# Patient Record
Sex: Female | Born: 1935 | Race: Black or African American | Hispanic: No | Marital: Married | Smoking: Never smoker
Health system: Southern US, Community
[De-identification: ages and names within clinical notes are randomized; demographics above are authoritative.]

## PROBLEM LIST (undated history)

## (undated) DIAGNOSIS — N289 Disorder of kidney and ureter, unspecified: Secondary | ICD-10-CM

## (undated) DIAGNOSIS — I10 Essential (primary) hypertension: Secondary | ICD-10-CM

## (undated) DIAGNOSIS — I4891 Unspecified atrial fibrillation: Secondary | ICD-10-CM

## (undated) DIAGNOSIS — M797 Fibromyalgia: Secondary | ICD-10-CM

## (undated) DIAGNOSIS — Z992 Dependence on renal dialysis: Secondary | ICD-10-CM

## (undated) HISTORY — PX: CHOLECYSTECTOMY: SHX55

## (undated) HISTORY — PX: CARDIAC SURGERY: SHX584

## (undated) HISTORY — PX: ABDOMINAL HYSTERECTOMY: SHX81

---

## 2014-04-18 ENCOUNTER — Encounter (HOSPITAL_COMMUNITY): Payer: Self-pay | Admitting: Family Medicine

## 2014-04-18 ENCOUNTER — Emergency Department (HOSPITAL_COMMUNITY)
Admission: EM | Admit: 2014-04-18 | Discharge: 2014-04-18 | Discharge status: Absent without leave | Payer: Self-pay | Source: Home / Self Care

## 2014-04-18 ENCOUNTER — Inpatient Hospital Stay (HOSPITAL_COMMUNITY)
Admission: EM | Admit: 2014-04-18 | Discharge: 2014-05-06 | DRG: 252 | Disposition: A | Discharge status: Home Health | Payer: Medicare HMO | Attending: Vascular Surgery | Admitting: Vascular Surgery

## 2014-04-18 ENCOUNTER — Inpatient Hospital Stay (HOSPITAL_COMMUNITY): Payer: Medicare HMO

## 2014-04-18 DIAGNOSIS — Z6831 Body mass index (BMI) 31.0-31.9, adult: Secondary | ICD-10-CM | POA: Diagnosis not present

## 2014-04-18 DIAGNOSIS — N186 End stage renal disease: Secondary | ICD-10-CM

## 2014-04-18 DIAGNOSIS — T827XXA Infection and inflammatory reaction due to other cardiac and vascular devices, implants and grafts, initial encounter: Principal | ICD-10-CM | POA: Diagnosis present

## 2014-04-18 DIAGNOSIS — E43 Unspecified severe protein-calorie malnutrition: Secondary | ICD-10-CM | POA: Diagnosis present

## 2014-04-18 DIAGNOSIS — D649 Anemia, unspecified: Secondary | ICD-10-CM | POA: Diagnosis present

## 2014-04-18 DIAGNOSIS — Z992 Dependence on renal dialysis: Secondary | ICD-10-CM

## 2014-04-18 DIAGNOSIS — T39395A Adverse effect of other nonsteroidal anti-inflammatory drugs [NSAID], initial encounter: Secondary | ICD-10-CM | POA: Diagnosis present

## 2014-04-18 DIAGNOSIS — M1990 Unspecified osteoarthritis, unspecified site: Secondary | ICD-10-CM | POA: Diagnosis present

## 2014-04-18 DIAGNOSIS — N19 Unspecified kidney failure: Secondary | ICD-10-CM

## 2014-04-18 DIAGNOSIS — I4891 Unspecified atrial fibrillation: Secondary | ICD-10-CM | POA: Diagnosis present

## 2014-04-18 DIAGNOSIS — I9589 Other hypotension: Secondary | ICD-10-CM | POA: Diagnosis present

## 2014-04-18 DIAGNOSIS — Y733 Surgical instruments, materials and gastroenterology and urology devices (including sutures) associated with adverse incidents: Secondary | ICD-10-CM | POA: Diagnosis present

## 2014-04-18 DIAGNOSIS — Z9049 Acquired absence of other specified parts of digestive tract: Secondary | ICD-10-CM | POA: Diagnosis present

## 2014-04-18 DIAGNOSIS — K904 Malabsorption due to intolerance, not elsewhere classified: Secondary | ICD-10-CM | POA: Diagnosis present

## 2014-04-18 DIAGNOSIS — Z7902 Long term (current) use of antithrombotics/antiplatelets: Secondary | ICD-10-CM | POA: Diagnosis not present

## 2014-04-18 DIAGNOSIS — I2510 Atherosclerotic heart disease of native coronary artery without angina pectoris: Secondary | ICD-10-CM | POA: Diagnosis present

## 2014-04-18 DIAGNOSIS — D696 Thrombocytopenia, unspecified: Secondary | ICD-10-CM | POA: Diagnosis present

## 2014-04-18 DIAGNOSIS — Z880 Allergy status to penicillin: Secondary | ICD-10-CM | POA: Diagnosis not present

## 2014-04-18 DIAGNOSIS — Z7982 Long term (current) use of aspirin: Secondary | ICD-10-CM | POA: Diagnosis not present

## 2014-04-18 DIAGNOSIS — I120 Hypertensive chronic kidney disease with stage 5 chronic kidney disease or end stage renal disease: Secondary | ICD-10-CM | POA: Diagnosis present

## 2014-04-18 DIAGNOSIS — I509 Heart failure, unspecified: Secondary | ICD-10-CM

## 2014-04-18 DIAGNOSIS — I82211 Chronic embolism and thrombosis of superior vena cava: Secondary | ICD-10-CM | POA: Diagnosis present

## 2014-04-18 DIAGNOSIS — Z90710 Acquired absence of both cervix and uterus: Secondary | ICD-10-CM

## 2014-04-18 DIAGNOSIS — Z452 Encounter for adjustment and management of vascular access device: Secondary | ICD-10-CM

## 2014-04-18 DIAGNOSIS — Z993 Dependence on wheelchair: Secondary | ICD-10-CM

## 2014-04-18 DIAGNOSIS — M797 Fibromyalgia: Secondary | ICD-10-CM | POA: Diagnosis present

## 2014-04-18 DIAGNOSIS — I82291 Chronic embolism and thrombosis of other thoracic veins: Secondary | ICD-10-CM | POA: Diagnosis present

## 2014-04-18 DIAGNOSIS — N2581 Secondary hyperparathyroidism of renal origin: Secondary | ICD-10-CM | POA: Diagnosis present

## 2014-04-18 DIAGNOSIS — T8140XA Infection following a procedure, unspecified, initial encounter: Secondary | ICD-10-CM

## 2014-04-18 DIAGNOSIS — T82898A Other specified complication of vascular prosthetic devices, implants and grafts, initial encounter: Secondary | ICD-10-CM

## 2014-04-18 DIAGNOSIS — E876 Hypokalemia: Secondary | ICD-10-CM | POA: Diagnosis present

## 2014-04-18 DIAGNOSIS — Z01810 Encounter for preprocedural cardiovascular examination: Secondary | ICD-10-CM

## 2014-04-18 HISTORY — DX: Fibromyalgia: M79.7

## 2014-04-18 HISTORY — DX: Disorder of kidney and ureter, unspecified: N28.9

## 2014-04-18 HISTORY — DX: Dependence on renal dialysis: Z99.2

## 2014-04-18 HISTORY — DX: Essential (primary) hypertension: I10

## 2014-04-18 HISTORY — DX: Unspecified atrial fibrillation: I48.91

## 2014-04-18 LAB — BASIC METABOLIC PANEL
Anion gap: 19 — ABNORMAL HIGH (ref 5–15)
BUN: 34 mg/dL — ABNORMAL HIGH (ref 6–23)
CALCIUM: 8.8 mg/dL (ref 8.4–10.5)
CO2: 26 mEq/L (ref 19–32)
Chloride: 97 mEq/L (ref 96–112)
Creatinine, Ser: 7.24 mg/dL — ABNORMAL HIGH (ref 0.50–1.10)
GFR calc Af Amer: 6 mL/min — ABNORMAL LOW (ref >90)
GFR, EST NON AFRICAN AMERICAN: 5 mL/min — AB (ref >90)
Glucose, Bld: 114 mg/dL — ABNORMAL HIGH (ref 70–99)
Potassium: 3.6 mEq/L — ABNORMAL LOW (ref 3.7–5.3)
Sodium: 142 mEq/L (ref 137–147)

## 2014-04-18 LAB — CBC WITH DIFFERENTIAL/PLATELET
BASOS ABS: 0 10*3/uL (ref 0.0–0.1)
BASOS PCT: 0 % (ref 0–1)
EOS PCT: 1 % (ref 0–5)
Eosinophils Absolute: 0.1 10*3/uL (ref 0.0–0.7)
HCT: 30.8 % — ABNORMAL LOW (ref 36.0–46.0)
Hemoglobin: 9.8 g/dL — ABNORMAL LOW (ref 12.0–15.0)
LUC, Absolute: 0 10*3/uL (ref 0.0–0.5)
LUCs, %: 0 % (ref 0–4)
Lymphocytes Relative: 17 % (ref 12–46)
Lymphs Abs: 1.7 10*3/uL (ref 0.7–4.0)
MCH: 26.3 pg (ref 26.0–34.0)
MCHC: 31.8 g/dL (ref 30.0–36.0)
MCV: 82.8 fL (ref 78.0–100.0)
Monocytes Absolute: 0.8 10*3/uL (ref 0.1–1.0)
Monocytes Relative: 8 % (ref 3–12)
Neutro Abs: 7.4 10*3/uL (ref 1.7–7.7)
Neutrophils Relative %: 75 % (ref 43–77)
Platelets: 153 10*3/uL (ref 150–400)
RBC: 3.72 MIL/uL — ABNORMAL LOW (ref 3.87–5.11)
RDW: 3.1 % — AB (ref 11.5–15.5)
WBC: 9.9 10*3/uL (ref 4.0–10.5)
nRBC: 3 /100 WBC — ABNORMAL HIGH

## 2014-04-18 MED ORDER — SUCRALFATE 1 G PO TABS
1.0000 g | ORAL_TABLET | Freq: Four times a day (QID) | ORAL | Status: DC
  Administered 2014-04-19 (×2): 1 g via ORAL
  Filled 2014-04-18 (×5): qty 1

## 2014-04-18 MED ORDER — SODIUM CHLORIDE 0.9 % IV BOLUS (SEPSIS)
1000.0000 mL | Freq: Once | INTRAVENOUS | Status: AC
  Administered 2014-04-18: 1000 mL via INTRAVENOUS

## 2014-04-18 MED ORDER — DULOXETINE HCL 60 MG PO CPEP
60.0000 mg | ORAL_CAPSULE | Freq: Every day | ORAL | Status: DC
  Filled 2014-04-18: qty 1

## 2014-04-18 MED ORDER — PRAVASTATIN SODIUM 20 MG PO TABS
20.0000 mg | ORAL_TABLET | Freq: Every day | ORAL | Status: DC
  Filled 2014-04-18: qty 1

## 2014-04-18 MED ORDER — MIDODRINE HCL 5 MG PO TABS
5.0000 mg | ORAL_TABLET | Freq: Every day | ORAL | Status: DC
  Filled 2014-04-18: qty 1

## 2014-04-18 MED ORDER — ALPRAZOLAM 0.25 MG PO TABS: 0.2500 mg | ORAL_TABLET | Freq: Every day | ORAL | Status: DC | PRN

## 2014-04-18 MED ORDER — TRAZODONE HCL 150 MG PO TABS
150.0000 mg | ORAL_TABLET | Freq: Every day | ORAL | Status: DC
  Administered 2014-04-19: 150 mg via ORAL
  Filled 2014-04-18 (×2): qty 1

## 2014-04-18 MED ORDER — CLONAZEPAM 1 MG PO TABS
1.0000 mg | ORAL_TABLET | Freq: Every day | ORAL | Status: DC
  Administered 2014-04-19: 1 mg via ORAL
  Filled 2014-04-18: qty 1

## 2014-04-18 MED ORDER — ZONISAMIDE 100 MG PO CAPS
100.0000 mg | ORAL_CAPSULE | Freq: Three times a day (TID) | ORAL | Status: DC
Start: 2014-04-19 — End: 2014-05-06
  Administered 2014-04-19 – 2014-05-06 (×46): 100 mg via ORAL
  Filled 2014-04-18 (×57): qty 1

## 2014-04-18 MED ORDER — BISACODYL 5 MG PO TBEC: 5.0000 mg | DELAYED_RELEASE_TABLET | Freq: Every day | ORAL | Status: DC | PRN

## 2014-04-18 MED ORDER — ASPIRIN EC 81 MG PO TBEC
81.0000 mg | DELAYED_RELEASE_TABLET | Freq: Every day | ORAL | Status: DC
  Filled 2014-04-18: qty 1

## 2014-04-18 MED ORDER — CLOPIDOGREL BISULFATE 75 MG PO TABS
75.0000 mg | ORAL_TABLET | Freq: Every day | ORAL | Status: DC
  Filled 2014-04-18: qty 1

## 2014-04-18 NOTE — ED Provider Notes (Signed)
CSN: [id number]     Arrival date & time 04/18/14  1757 History   First MD Initiated Contact with Patient 04/18/14 2026     Chief Complaint  Patient presents with  . Groin Pain  . Wound Infection     (Consider location/radiation/quality/duration/timing/severity/associated sxs/prior Treatment) HPI 78 year old female here from out of town who presents today complaining of pain and discharge at the site of her left femoral dialysis graft. This was placed in [place] on November 20. She has not any problems until today. She is visiting her son from out of town and has been dialyzed twice while she's been here. She has a dialysis catheter placed in the right groin. She denies fever, red streaking, chills, nausea, redness, or vomiting. Past Medical History  Diagnosis Date  . Renal disorder   . Dialysis patient   . Atrial fibrillation   . Fibromyalgia   . Hypertension    Past Surgical History  Procedure Laterality Date  . Cardiac surgery    . Cholecystectomy    . Abdominal hysterectomy     History reviewed. No pertinent family history. History  Substance Use Topics  . Smoking status: Never Smoker   . Smokeless tobacco: Not on file  . Alcohol Use: Not on file   OB History    No data available     Review of Systems  All other systems reviewed and are negative.     Allergies  Penicillins  Home Medications   Prior to Admission medications   Not on File   BP 110/73 mmHg  Pulse 87  Temp(Src) 98.4 F (36.9 C) (Oral)  Resp 18  SpO2 100% Physical Exam  Constitutional: She appears well-developed and well-nourished.  HENT:  Head: Normocephalic and atraumatic.  Right Ear: External ear normal.  Left Ear: External ear normal.  Eyes: Conjunctivae and EOM are normal. Pupils are equal, round, and reactive to light.  Neck: Normal range of motion. Thyromegaly present.  Cardiovascular: An irregularly irregular rhythm present.  Pulmonary/Chest: Effort normal and breath sounds  normal.  Abdominal: Soft. Bowel sounds are normal.  Musculoskeletal:       Legs: Staples in place in left groin with some tenderness to palpation and partially discharge noted. Right groin with dialysis catheter in place.  Neurological: She is alert. She displays abnormal reflex. No cranial nerve deficit. Coordination normal.  Skin: Skin is warm and dry.  Psychiatric: She has a normal mood and affect.  Nursing note and vitals reviewed.   ED Course  Procedures (including critical care time) Labs Review Labs Reviewed  CULTURE, BLOOD (ROUTINE X 2)  CULTURE, BLOOD (ROUTINE X 2)  CBC WITH DIFFERENTIAL  BASIC METABOLIC PANEL    Imaging Review No results found.   EKG Interpretation   Date/Time:  Monday April 18 2014 20:53:31 EST Ventricular Rate:  101 PR Interval:    QRS Duration: 81 QT Interval:  366 QTC Calculation: 474 R Axis:   38 Text Interpretation:  Atrial fibrillation Low voltage, extremity and  precordial leads Nonspecific T abnrm, anterolateral leads Baseline wander  in lead(s) V4 Confirmed by Court Gracia MD, Andee Poles ([id number]) on 04/18/2014 8:57:26  PM      MDM   Final diagnoses:  Preop cardiovascular exam  Post op infection  Renal failure    Patient care discussed with Dr. Oneida Alar and he will be in to assess. She has had blood cultures CBC and be met drawn. IV is to be replaced.  Dr. Oneida Alar in department and admitting patient  Shaune Pollack, MD 04/18/14 [phone]

## 2014-04-18 NOTE — ED Notes (Signed)
Pt sts she had a graft placed in the left groin 2 weeks ago and now possible infection. sts drainage. sts she has a temporary catheter in the right groin. Pt sts last dialysis saturday

## 2014-04-18 NOTE — H&P (Signed)
[facility] [place] HISTORY AND PHYSICAL   History of Present Illness:  Patient is a 78 y.o. year old female who presents for evaluation of left groin drainage.  Pt had left thigh graft placed in [place] approximately 2 weeks ago.  Began to have some pain and clear drainage from groin today.  She is planning on staying in the area for the next 3 months.  She currently is dialyzing T Th [facility]  She denies fever or chills.  She has had no nausea or vomiting.  Other medical problems include atrial fibrillation (she is not clear on why coumadin was stopped), fibromyalgia, arthritis.  Her cause of renal failure was from NSAIDS for her arthritis.  She has been on dialysis approximately 4 years.  She has had multiple failed arm grafts.  She currently is dialyzing via a right femoral catheter.  She is essentially non ambulatory and uses a wheelchair.  Past Medical History  Diagnosis Date  . Renal disorder   . Dialysis patient   . Atrial fibrillation   . Fibromyalgia   . Hypertension     Past Surgical History  Procedure Laterality Date  . Cardiac surgery    . Cholecystectomy    . Abdominal hysterectomy      Social History History  Substance Use Topics  . Smoking status: Never Smoker   . Smokeless tobacco: Not on file  . Alcohol Use: Not on file    Family History History reviewed. No pertinent family history. Denies history of renal failure  Allergies  Allergies  Allergen Reactions  . Lactose Intolerance (Gi) Diarrhea  . Penicillins Itching     Current Facility-Administered Medications  Medication Dose Route Frequency Provider Last Rate Last Dose  . ALPRAZolam (XANAX) tablet 0.25 mg  0.25 mg Oral Daily PRN Sherren Kernsharles E Paxtyn Wisdom, MD      . Melene Muller[START ON 04/19/2014] aspirin EC tablet 81 mg  81 mg Oral Daily Sherren Kernsharles E Aarit Kashuba, MD      . bisacodyl (DULCOLAX) EC tablet 5 mg  5 mg Oral Daily PRN Sherren Kernsharles E Irby Fails, MD      . clonazePAM Scarlette Calico(KLONOPIN) tablet 1 mg  1 mg  Oral QHS Sherren Kernsharles E Homer Miller, MD      . Melene Muller[START ON 04/19/2014] clopidogrel (PLAVIX) tablet 75 mg  75 mg Oral Daily Sherren Kernsharles E Jeweline Reif, MD      . Melene Muller[START ON 04/19/2014] DULoxetine (CYMBALTA) DR capsule 60 mg  60 mg Oral Daily Sherren Kernsharles E Chayse Gracey, MD      . Melene Muller[START ON 04/19/2014] midodrine (PROAMATINE) tablet 5 mg  5 mg Oral Daily Sherren Kernsharles E Ricardo Schubach, MD      . pravastatin (PRAVACHOL) tablet 20 mg  20 mg Oral QHS Sherren Kernsharles E Masa Lubin, MD      . sucralfate (CARAFATE) tablet 1 g  1 g Oral QID Sherren Kernsharles E Merrily Tegeler, MD      . traZODone (DESYREL) tablet 150 mg  150 mg Oral QHS Sherren Kernsharles E Lisett Dirusso, MD      . zonisamide (ZONEGRAN) capsule 100 mg  100 mg Oral TID Sherren Kernsharles E Denisse Whitenack, MD       Current Outpatient Prescriptions  Medication Sig Dispense Refill  . ALPRAZolam (XANAX) 0.25 MG tablet Take 0.25 mg by mouth daily as needed for anxiety (TAKES BEFORE DIALYSIS ONLY).   2  . aspirin EC 81 MG tablet Take 81 mg by mouth daily.    . clonazePAM (KLONOPIN) 1 MG tablet Take 1 mg by mouth at  bedtime.   5  . clopidogrel (PLAVIX) 75 MG tablet Take 75 mg by mouth daily.  5  . [facility] BISACODYL 5 MG EC tablet Take 5 mg by mouth daily as needed for moderate constipation.   0  . DULoxetine (CYMBALTA) 60 MG capsule Take 60 mg by mouth daily.  5  . HYDROcodone-acetaminophen (NORCO/VICODIN) 5-325 MG per tablet Take 1 tablet by mouth every 4 (four) hours as needed.  0  . midodrine (PROAMATINE) 5 MG tablet Take 5 mg by mouth daily.  0  . pravastatin (PRAVACHOL) 20 MG tablet Take 20 mg by mouth at bedtime.   3  . sucralfate (CARAFATE) 1 G tablet Take 1 g by mouth 4 (four) times daily.  3  . traZODone (DESYREL) 150 MG tablet Take 150 mg by mouth at bedtime.  5  . zonisamide (ZONEGRAN) 100 MG capsule Take 100 mg by mouth 3 (three) times daily.  3    ROS:   General:  No weight loss, Fever, chills  HEENT: No recent headaches, no nasal bleeding, no visual changes, no sore throat  Neurologic: No dizziness, blackouts, seizures. No recent symptoms of  stroke or mini- stroke. No recent episodes of slurred speech, or temporary blindness.  Cardiac: No recent episodes of chest pain/pressure, no shortness of breath at rest.  No shortness of breath with exertion.  + history of atrial fibrillation or irregular heartbeat  Vascular: No history of rest pain in feet.  No history of claudication.  No history of non-healing ulcer, No history of DVT   Pulmonary: No home oxygen, no productive cough, no hemoptysis,  No asthma or wheezing  Musculoskeletal:  [x ] Arthritis, [ ]  Low back pain,  [ ]  Joint pain  Hematologic:No history of hypercoagulable state.  No history of easy bleeding.  No history of anemia  Gastrointestinal: No hematochezia or melena,  No gastroesophageal reflux, no trouble swallowing  Urinary: [x ] chronic Kidney disease, [x ] on HD - [ ]  MWF or [x ] TTHS, [ ]  Burning with urination, [ ]  Frequent urination, [ ]  Difficulty urinating;   Skin: No rashes  Psychological: + history of anxiety,  + history of depression   Physical Examination  Filed Vitals:   04/18/14 1814 04/18/14 2034 04/18/14 2100 04/18/14 2145  BP: 99/44 110/73 108/42 105/43  Pulse: 86 87 73 98  Temp: 98.6 F (37 C) 98.4 F (36.9 C)    TempSrc: Oral Oral    Resp: 16 18 20 17   SpO2: 98% 100% 100% 100%    There is no height or weight on file to calculate BMI.  General:  Alert and oriented, no acute distress HEENT: Normal Neck: No JVD Pulmonary: Clear to auscultation bilaterally Cardiac: Irregularly irregular Abdomen: Soft, non-tender, non-distended, no mass Skin: No rash, left groin macerated skin with serous drainage, no erythema, no purulence, no mass, left thigh graft is pulsatile, central 2/3 of skin staples removed from left groin incision which is 5 cm in length Extremity Pulses:  2+ radial, brachial, femoral, right dorsalis pedis, absent left DP and posterior tibial pulses bilaterally Musculoskeletal: No deformity or edema  Neurologic: Upper and  lower extremity motor 5/5 and symmetric  ASSESSMENT:  Possible early graft infection vs lymphatic leak   PLAN: 1.  Consult renal service for dialysis needs.  2.  Admit for IV antibiotics Vanc Zosyn dry dressing for groin  3.  Afib rate controlled for now.  Currently not on anticoagulation reason unknown.   Leonette Mostharles  Crespin Forstrom, MD [facility] Office: [phone] Pager: [phone]

## 2014-04-19 LAB — BASIC METABOLIC PANEL
Anion gap: 19 — ABNORMAL HIGH (ref 5–15)
BUN: 34 mg/dL — AB (ref 6–23)
CHLORIDE: 99 meq/L (ref 96–112)
CO2: 24 mEq/L (ref 19–32)
Calcium: 8.3 mg/dL — ABNORMAL LOW (ref 8.4–10.5)
Creatinine, Ser: 7.24 mg/dL — ABNORMAL HIGH (ref 0.50–1.10)
GFR calc Af Amer: 6 mL/min — ABNORMAL LOW (ref >90)
GFR calc non Af Amer: 5 mL/min — ABNORMAL LOW (ref >90)
GLUCOSE: 125 mg/dL — AB (ref 70–99)
Potassium: 3.5 mEq/L — ABNORMAL LOW (ref 3.7–5.3)
Sodium: 142 mEq/L (ref 137–147)

## 2014-04-19 LAB — CBC
HEMATOCRIT: 30.9 % — AB (ref 36.0–46.0)
Hemoglobin: 9.8 g/dL — ABNORMAL LOW (ref 12.0–15.0)
MCH: 26.3 pg (ref 26.0–34.0)
MCHC: 31.7 g/dL (ref 30.0–36.0)
MCV: 83.1 fL (ref 78.0–100.0)
Platelets: 135 10*3/uL — ABNORMAL LOW (ref 150–400)
RBC: 3.72 MIL/uL — ABNORMAL LOW (ref 3.87–5.11)
RDW: 19.2 % — ABNORMAL HIGH (ref 11.5–15.5)
WBC: 9.6 10*3/uL (ref 4.0–10.5)

## 2014-04-19 LAB — HEPATITIS B SURFACE ANTIGEN: HEP B S AG: NEGATIVE

## 2014-04-19 MED ORDER — PRAVASTATIN SODIUM 20 MG PO TABS
20.0000 mg | ORAL_TABLET | ORAL | Status: DC
  Administered 2014-04-19 – 2014-05-05 (×17): 20 mg via ORAL
  Filled 2014-04-19 (×21): qty 1

## 2014-04-19 MED ORDER — PANTOPRAZOLE SODIUM 40 MG PO TBEC
40.0000 mg | DELAYED_RELEASE_TABLET | ORAL | Status: DC
  Administered 2014-04-19 – 2014-05-05 (×17): 40 mg via ORAL
  Filled 2014-04-19 (×16): qty 1

## 2014-04-19 MED ORDER — CLONAZEPAM 1 MG PO TABS
1.0000 mg | ORAL_TABLET | ORAL | Status: DC
  Administered 2014-04-19 – 2014-05-05 (×14): 1 mg via ORAL
  Filled 2014-04-19 (×16): qty 1

## 2014-04-19 MED ORDER — MIDODRINE HCL 5 MG PO TABS
5.0000 mg | ORAL_TABLET | ORAL | Status: DC
  Administered 2014-04-19 – 2014-04-22 (×5): 5 mg via ORAL
  Filled 2014-04-19 (×6): qty 1

## 2014-04-19 MED ORDER — MIDODRINE HCL 5 MG PO TABS
ORAL_TABLET | ORAL | Status: AC
  Filled 2014-04-19: qty 1

## 2014-04-19 MED ORDER — VANCOMYCIN HCL 10 G IV SOLR
1500.0000 mg | Freq: Once | INTRAVENOUS | Status: AC
  Administered 2014-04-19: 1500 mg via INTRAVENOUS
  Filled 2014-04-19: qty 1500

## 2014-04-19 MED ORDER — DOCUSATE SODIUM 100 MG PO CAPS
100.0000 mg | ORAL_CAPSULE | Freq: Two times a day (BID) | ORAL | Status: DC
  Administered 2014-04-19 – 2014-04-28 (×7): 100 mg via ORAL
  Filled 2014-04-19 (×24): qty 1

## 2014-04-19 MED ORDER — LABETALOL HCL 5 MG/ML IV SOLN
10.0000 mg | INTRAVENOUS | Status: DC | PRN
  Filled 2014-04-19: qty 4

## 2014-04-19 MED ORDER — MIDODRINE HCL 5 MG PO TABS: 5.0000 mg | ORAL_TABLET | Freq: Every day | ORAL | Status: DC

## 2014-04-19 MED ORDER — SODIUM CHLORIDE 0.9 % IJ SOLN: 3.0000 mL | INTRAMUSCULAR | Status: DC | PRN

## 2014-04-19 MED ORDER — ACETAMINOPHEN 325 MG PO TABS
ORAL_TABLET | ORAL | Status: AC
  Filled 2014-04-19: qty 2

## 2014-04-19 MED ORDER — VANCOMYCIN HCL IN DEXTROSE 1-5 GM/200ML-% IV SOLN: 1000.0000 mg | Freq: Two times a day (BID) | INTRAVENOUS | Status: DC

## 2014-04-19 MED ORDER — DULOXETINE HCL 60 MG PO CPEP
60.0000 mg | ORAL_CAPSULE | ORAL | Status: DC
  Administered 2014-04-19 – 2014-05-05 (×17): 60 mg via ORAL
  Filled 2014-04-19 (×20): qty 1

## 2014-04-19 MED ORDER — ASPIRIN EC 81 MG PO TBEC
81.0000 mg | DELAYED_RELEASE_TABLET | ORAL | Status: DC
  Administered 2014-04-19 – 2014-04-26 (×8): 81 mg via ORAL
  Filled 2014-04-19 (×10): qty 1

## 2014-04-19 MED ORDER — OXYCODONE-ACETAMINOPHEN 5-325 MG PO TABS
1.0000 | ORAL_TABLET | ORAL | Status: DC | PRN
  Administered 2014-04-19 – 2014-04-24 (×6): 1 via ORAL
  Administered 2014-04-26: 2 via ORAL
  Administered 2014-04-30 – 2014-05-01 (×3): 1 via ORAL
  Administered 2014-05-03 (×2): 2 via ORAL
  Administered 2014-05-03: 1 via ORAL
  Administered 2014-05-05 – 2014-05-06 (×2): 2 via ORAL
  Filled 2014-04-19 (×4): qty 1
  Filled 2014-04-19 (×2): qty 2
  Filled 2014-04-19: qty 1
  Filled 2014-04-19: qty 2
  Filled 2014-04-19: qty 1
  Filled 2014-04-19: qty 2
  Filled 2014-04-19 (×2): qty 1
  Filled 2014-04-19: qty 2
  Filled 2014-04-19: qty 1

## 2014-04-19 MED ORDER — TRAZODONE HCL 150 MG PO TABS
150.0000 mg | ORAL_TABLET | ORAL | Status: DC
  Administered 2014-04-19 – 2014-05-05 (×16): 150 mg via ORAL
  Filled 2014-04-19 (×20): qty 1

## 2014-04-19 MED ORDER — PANTOPRAZOLE SODIUM 40 MG PO TBEC
40.0000 mg | DELAYED_RELEASE_TABLET | Freq: Every day | ORAL | Status: DC
  Filled 2014-04-19: qty 1

## 2014-04-19 MED ORDER — RENA-VITE PO TABS
1.0000 | ORAL_TABLET | Freq: Every day | ORAL | Status: DC
Start: 2014-04-19 — End: 2014-05-06
  Administered 2014-04-20 – 2014-05-05 (×15): 1 via ORAL
  Filled 2014-04-19 (×19): qty 1

## 2014-04-19 MED ORDER — METOPROLOL TARTRATE 1 MG/ML IV SOLN: 2.0000 mg | INTRAVENOUS | Status: DC | PRN

## 2014-04-19 MED ORDER — PIPERACILLIN-TAZOBACTAM 3.375 G IVPB
3.3750 g | Freq: Three times a day (TID) | INTRAVENOUS | Status: DC
  Filled 2014-04-19 (×2): qty 50

## 2014-04-19 MED ORDER — CLOPIDOGREL BISULFATE 75 MG PO TABS
75.0000 mg | ORAL_TABLET | ORAL | Status: DC
  Administered 2014-04-19 – 2014-04-26 (×8): 75 mg via ORAL
  Filled 2014-04-19 (×10): qty 1

## 2014-04-19 MED ORDER — MORPHINE SULFATE 2 MG/ML IJ SOLN
2.0000 mg | INTRAMUSCULAR | Status: DC | PRN
  Administered 2014-04-27 (×2): 2 mg via INTRAVENOUS
  Filled 2014-04-19 (×2): qty 1

## 2014-04-19 MED ORDER — CALCITRIOL 0.5 MCG PO CAPS
0.5000 ug | ORAL_CAPSULE | ORAL | Status: DC
  Administered 2014-04-23 – 2014-05-05 (×6): 0.5 ug via ORAL
  Filled 2014-04-19 (×8): qty 1

## 2014-04-19 MED ORDER — ONDANSETRON HCL 4 MG/2ML IJ SOLN
4.0000 mg | Freq: Four times a day (QID) | INTRAMUSCULAR | Status: DC | PRN
  Administered 2014-04-22: 4 mg via INTRAVENOUS
  Filled 2014-04-19: qty 2

## 2014-04-19 MED ORDER — SODIUM CHLORIDE 0.9 % IV SOLN
250.0000 mL | INTRAVENOUS | Status: DC | PRN
  Administered 2014-04-27 (×2): via INTRAVENOUS

## 2014-04-19 MED ORDER — HYDRALAZINE HCL 20 MG/ML IJ SOLN: 5.0000 mg | INTRAMUSCULAR | Status: DC | PRN

## 2014-04-19 MED ORDER — SODIUM CHLORIDE 0.9 % IV SOLN
62.5000 mg | INTRAVENOUS | Status: DC
  Administered 2014-04-21 – 2014-05-05 (×3): 62.5 mg via INTRAVENOUS
  Filled 2014-04-19 (×6): qty 5

## 2014-04-19 MED ORDER — HEPARIN SODIUM (PORCINE) 5000 UNIT/ML IJ SOLN
5000.0000 [IU] | Freq: Three times a day (TID) | INTRAMUSCULAR | Status: DC
  Administered 2014-04-19 – 2014-04-27 (×23): 5000 [IU] via SUBCUTANEOUS
  Filled 2014-04-19 (×30): qty 1

## 2014-04-19 MED ORDER — ACETAMINOPHEN 650 MG RE SUPP: 325.0000 mg | RECTAL | Status: DC | PRN

## 2014-04-19 MED ORDER — SODIUM CHLORIDE 0.9 % IJ SOLN
3.0000 mL | Freq: Two times a day (BID) | INTRAMUSCULAR | Status: DC
  Administered 2014-04-19 – 2014-05-02 (×16): 3 mL via INTRAVENOUS

## 2014-04-19 MED ORDER — VANCOMYCIN HCL IN DEXTROSE 750-5 MG/150ML-% IV SOLN
750.0000 mg | INTRAVENOUS | Status: DC
  Administered 2014-04-19: 750 mg via INTRAVENOUS
  Filled 2014-04-19 (×3): qty 150

## 2014-04-19 MED ORDER — PIPERACILLIN-TAZOBACTAM IN DEX 2-0.25 GM/50ML IV SOLN
2.2500 g | Freq: Three times a day (TID) | INTRAVENOUS | Status: DC
  Administered 2014-04-19 – 2014-05-03 (×42): 2.25 g via INTRAVENOUS
  Filled 2014-04-19 (×48): qty 50

## 2014-04-19 MED ORDER — BISACODYL 10 MG RE SUPP: 10.0000 mg | Freq: Every day | RECTAL | Status: DC | PRN

## 2014-04-19 MED ORDER — ACETAMINOPHEN 325 MG PO TABS
325.0000 mg | ORAL_TABLET | ORAL | Status: DC | PRN
  Administered 2014-04-19 – 2014-04-22 (×3): 650 mg via ORAL
  Filled 2014-04-19 (×2): qty 2

## 2014-04-19 NOTE — Progress Notes (Addendum)
  [facility] Progress Note  04/19/2014 2:40 PM  Subjective:  No complaints. Had pain in left thigh that is well controlled with pain medicine.   Tmax 98.6 BP sys 90s-115 02 99% 3L  Filed Vitals:   04/19/14 1359  BP: 91/47  Pulse: 85  Temp: 98.4 F (36.9 C)  Resp: 17    Physical Exam: Incisions:  Left groin incision with maceration and active serous drainage. Staples to proximal 2/3 of incision. Dressing was soaked through. Last dressing change around 1300. No erythema. No purulence. Right thigh perm cath.   CBC    Component Value Date/Time   WBC 9.6 04/19/2014 0310   RBC 3.72* 04/19/2014 0310   HGB 9.8* 04/19/2014 0310   HCT 30.9* 04/19/2014 0310   PLT 135* 04/19/2014 0310   MCV 83.1 04/19/2014 0310   MCH 26.3 04/19/2014 0310   MCHC 31.7 04/19/2014 0310   RDW 19.2* 04/19/2014 0310   LYMPHSABS 1.7 04/18/2014 2102   MONOABS 0.8 04/18/2014 2102   EOSABS 0.1 04/18/2014 2102   BASOSABS 0 04/18/2014 2102    BMET    Component Value Date/Time   NA 142 04/19/2014 0310   K 3.5* 04/19/2014 0310   CL 99 04/19/2014 0310   CO2 24 04/19/2014 0310   GLUCOSE 125* 04/19/2014 0310   BUN 34* 04/19/2014 0310   CREATININE 7.24* 04/19/2014 0310   CALCIUM 8.3* 04/19/2014 0310   GFRNONAA 5* 04/19/2014 0310   GFRAA 6* 04/19/2014 0310    INR No results found for: INR   Intake/Output Summary (Last 24 hours) at 04/19/14 1440 Last data filed at 04/19/14 1300  Gross per 24 hour  Intake   1110 ml  Output      0 ml  Net   1110 ml     Assessment:  78 y.o. female with possibly early thigh graft infection vs lymphatic leak  Plan: -Continuous serous drainage from left groin. No purulence or erythema.  -Blood cultures pending. Wound culture obtained.  -Afebrile, no leukocytosis. -Continue vanc and zosyn -For dialysis today.  -DVT prophylaxis:  SQ heparin.    Maris BergerKimberly Trinh, PA-C [facility] Office: [phone] Pager:  [phone] 04/19/2014 2:40 PM     If continues current excessive drainage will consider VAC  Fabienne Brunsharles Makiah Clauson, MD [facility] [place] Office: [phone] Pager: [phone]

## 2014-04-19 NOTE — Plan of Care (Signed)
Problem: Phase I Progression Outcomes Goal: Hemodynamically stable Outcome: Completed/Met Date Met:  04/19/14

## 2014-04-19 NOTE — Progress Notes (Signed)
Pt did not tolerate HD tx well today. Hypotensive, fatigue and cramping for most of tx. Pt. Also ended tx 30 mins early due to cramps in her stomach and surgical site pain in left leg. 0.5 L removed. Pt states, "I do not feel like I have fluid on me". Unable to give pain meds other than 650 mg Tylenol due to hypotension. Report given to 2W RN.

## 2014-04-19 NOTE — Progress Notes (Signed)
ANTIBIOTIC CONSULT NOTE - INITIAL  Pharmacy Consult for Vancomycin and Zosyn  Indication: possible graft infection  Allergies  Allergen Reactions  . Lactose Intolerance (Gi) Diarrhea  . Penicillins Itching    Patient Measurements: Height: 5\' 2"  (157.5 cm) Weight: 172 lb 2.9 oz (78.1 kg) IBW/kg (Calculated) : 50.1  Vital Signs: Temp: 97.9 F (36.6 C) (11/30 2357) Temp Source: Oral (11/30 2357) BP: 100/56 mmHg (11/30 2357) Pulse Rate: 107 (11/30 2357) Intake/Output from previous day:   Intake/Output from this shift:    Labs:  Recent Labs  04/18/14 2102  WBC 9.9  HGB 9.8*  PLT 153  CREATININE 7.24*   Estimated Creatinine Clearance: 6.4 mL/min (by C-G formula based on Cr of 7.24). No results for input(s): VANCOTROUGH, VANCOPEAK, VANCORANDOM, GENTTROUGH, GENTPEAK, GENTRANDOM, TOBRATROUGH, TOBRAPEAK, TOBRARND, AMIKACINPEAK, AMIKACINTROU, AMIKACIN in the last 72 hours.   Microbiology: No results found for this or any previous visit (from the past 720 hour(s)).  Medical History: Past Medical History  Diagnosis Date  . Renal disorder   . Dialysis patient   . Atrial fibrillation   . Fibromyalgia   . Hypertension     Medications:  Prescriptions prior to admission  Medication Sig Dispense Refill Last Dose  . ALPRAZolam (XANAX) 0.25 MG tablet Take 0.25 mg by mouth daily as needed for anxiety (TAKES BEFORE DIALYSIS ONLY).   2 04/18/2014 at Unknown time  . aspirin EC 81 MG tablet Take 81 mg by mouth daily.   04/18/2014 at Unknown time  . clonazePAM (KLONOPIN) 1 MG tablet Take 1 mg by mouth at bedtime.   5 04/17/2014 at Unknown time  . clopidogrel (PLAVIX) 75 MG tablet Take 75 mg by mouth daily.  5 04/18/2014 at Unknown time  . [facility]S BISACODYL 5 MG EC tablet Take 5 mg by mouth daily as needed for moderate constipation.   0 Past Week at Unknown time  . DULoxetine (CYMBALTA) 60 MG capsule Take 60 mg by mouth daily.  5 04/18/2014 at Unknown time  . HYDROcodone-acetaminophen  (NORCO/VICODIN) 5-325 MG per tablet Take 1 tablet by mouth every 4 (four) hours as needed.  0 04/17/2014 at Unknown time  . midodrine (PROAMATINE) 5 MG tablet Take 5 mg by mouth daily.  0 04/18/2014 at Unknown time  . pravastatin (PRAVACHOL) 20 MG tablet Take 20 mg by mouth at bedtime.   3 04/17/2014 at Unknown time  . sucralfate (CARAFATE) 1 G tablet Take 1 g by mouth 4 (four) times daily.  3 04/18/2014 at Unknown time  . traZODone (DESYREL) 150 MG tablet Take 150 mg by mouth at bedtime.  5 04/17/2014 at Unknown time  . zonisamide (ZONEGRAN) 100 MG capsule Take 100 mg by mouth 3 (three) times daily.  3 04/18/2014 at Unknown time   Assessment: 78 yo female with left groin drainage, possible graft infection, for empiric antibiotics  Goal of Therapy:  Vancomycin pre-HD level 15-25  Plan:  Vancomycin 1500 mg IV now, then 750 mg IV after each HD Change Zosyn 2.25 g IV q8h (OKto give per MD with allergy)  Christyl Osentoski, Gary FleetGregory Vernon 04/19/2014,12:37 AM

## 2014-04-19 NOTE — Progress Notes (Signed)
UR Completed.  Erika Maddox Jane [phone] 06/09/2013  

## 2014-04-19 NOTE — Procedures (Signed)
Pt seen at HD.  Ap 150  Vp 160  SBP 95.  Tolerating HD well so for.

## 2014-04-19 NOTE — Plan of Care (Signed)
Problem: Phase I Progression Outcomes Goal: Voiding-avoid urinary catheter unless indicated Outcome: Not Applicable Date Met:  03/09/10

## 2014-04-19 NOTE — Progress Notes (Signed)
Nutrition Brief Note  Patient identified on the Malnutrition Screening Tool (MST).  Wt Readings from Last 15 Encounters:  04/18/14 172 lb 2.9 oz (78.1 kg)    Body mass index is 31.48 kg/(m^2). Patient meets criteria for Obesity Class I based on current BMI.   Current diet order is Heart Healthy, patient is consuming approximately 100% of meals at this time. Labs and medications reviewed.   No nutrition interventions warranted at this time. If nutrition issues arise, please consult RD.   Maureen ChattersKatie Dayona Shaheen, RD, LDN Pager #: [phone] After-Hours Pager #: [phone]

## 2014-04-19 NOTE — Consult Note (Signed)
[facility] Renal Consultation Note  Indication for Consultation:  Management of ESRD/hemodialysis; anemia, hypertension/volume and secondary hyperparathyroidism  HPI: Erika Maddox is a 78 y.o. female  admittted by Dr. Oneida Alar with Left Avgg Possible early infection vs lymphatic leak. She is followed by a [facility]( ESRD presumed sec to NSAID use on  HD 4 years) and here visiting son "for 3 months", HD schedule TTS with last HD on schedule Sat. Reports Left femoral graft started hurting some and  draining large aounts yellowish liquid yesterday with out , fever, sweats or chills and came to ER.SHe reports Fem.  Faxon placed in [place] About Apr 08, 2014 without problems/ reports blat. upper arm access failed and also R Fem Perm cath  placed after arm access failures. She reports never having an IJ  HD cath "they said no room there". Her last 2 hd txs were at [facility] without reported problems. Currently denies any pain in leg.        Other reported problems include History of Atrial Fibrillation  And  Cardiac cath 1 month ago in [place] with "CAD just use meds,no surgery bad candidate I think". Stopped Coumadin past month she is not aware why, no bleeding problems hard to manage on HD per her history.No history of recnt SOB, chest pain or palpitations. Using walker for ambulation mainly, but able to stand and walk "a few steps at home using rolling walker device in her home."     Past Medical History  Diagnosis Date  . Renal disorder   . Dialysis patient   . Atrial fibrillation   . Fibromyalgia   . Hypertension     Past Surgical History  Procedure Laterality Date  . Cardiac surgery    . Cholecystectomy    . Abdominal hysterectomy       History reviewed. No pertinent family history. Social = Lives in [place], [place]   reports that she has never smoked. She does not have any smokeless tobacco  history on file. Her alcohol and drug histories are not on file.   Allergies  Allergen Reactions  . Lactose Intolerance (Gi) Diarrhea  . Penicillins Itching    Prior to Admission medications   Medication Sig Start Date End Date Taking? Authorizing Provider  ALPRAZolam (XANAX) 0.25 MG tablet Take 0.25 mg by mouth daily as needed for anxiety (TAKES BEFORE DIALYSIS ONLY).  03/30/14  Yes Historical Provider, MD  aspirin EC 81 MG tablet Take 81 mg by mouth daily.   Yes Historical Provider, MD  clonazePAM (KLONOPIN) 1 MG tablet Take 1 mg by mouth at bedtime.  01/20/14  Yes Historical Provider, MD  clopidogrel (PLAVIX) 75 MG tablet Take 75 mg by mouth daily. 04/05/14  Yes Historical Provider, MD  [employer] BISACODYL 5 MG EC tablet Take 5 mg by mouth daily as needed for moderate constipation.  03/08/14  Yes Historical Provider, MD  DULoxetine (CYMBALTA) 60 MG capsule Take 60 mg by mouth daily. 02/26/14  Yes Historical Provider, MD  HYDROcodone-acetaminophen (NORCO/VICODIN) 5-325 MG per tablet Take 1 tablet by mouth every 4 (four) hours as needed. 04/09/14  Yes Historical Provider, MD  midodrine (PROAMATINE) 5 MG tablet Take 5 mg by mouth daily. 04/07/14  Yes Historical Provider, MD  pravastatin (PRAVACHOL) 20 MG tablet Take 20 mg by mouth at bedtime.  02/11/14  Yes Historical Provider, MD  sucralfate (CARAFATE) 1 G tablet Take 1 g  by mouth 4 (four) times daily. 04/08/14  Yes Historical Provider, MD  traZODone (DESYREL) 150 MG tablet Take 150 mg by mouth at bedtime. 02/11/14  Yes Historical Provider, MD  zonisamide (ZONEGRAN) 100 MG capsule Take 100 mg by mouth 3 (three) times daily. 04/02/14  Yes Historical Provider, MD    Continuous:   Results for orders placed or performed during the hospital encounter of 04/18/14 (from the past 48 hour(s))  CBC with Differential     Status: Abnormal   Collection Time: 04/18/14  9:02 PM  Result Value Ref Range   WBC 9.9 4.0 - 10.5 K/uL   RBC 3.72 (L) 3.87 - 5.11 MIL/uL    Hemoglobin 9.8 (L) 12.0 - 15.0 g/dL   HCT 30.8 (L) 36.0 - 46.0 %   MCV 82.8 78.0 - 100.0 fL   MCH 26.3 26.0 - 34.0 pg   MCHC 31.8 30.0 - 36.0 g/dL   RDW 3.1 (L) 11.5 - 15.5 %   Platelets 153 150 - 400 K/uL    Comment: REPEATED TO VERIFY PLATELET COUNT CONFIRMED BY SMEAR    Neutrophils Relative % 75 43 - 77 %   Neutro Abs 7.4 1.7 - 7.7 K/uL   Lymphocytes Relative 17 12 - 46 %   Lymphs Abs 1.7 0.7 - 4.0 K/uL   Monocytes Relative 8 3 - 12 %   Monocytes Absolute 0.8 0.1 - 1.0 K/uL   Eosinophils Relative 1 0 - 5 %   Eosinophils Absolute 0.1 0.0 - 0.7 K/uL   Basophils Relative 0 0 - 1 %   Basophils Absolute 0 0.0 - 0.1 K/uL   LUCs, % 0 0 - 4 %   LUC, Absolute 0 0.0 - 0.5 K/uL   nRBC 3 (H) 0 /100 WBC  Basic metabolic panel     Status: Abnormal   Collection Time: 04/18/14  9:02 PM  Result Value Ref Range   Sodium 142 137 - 147 mEq/L   Potassium 3.6 (L) 3.7 - 5.3 mEq/L   Chloride 97 96 - 112 mEq/L   CO2 26 19 - 32 mEq/L   Glucose, Bld 114 (H) 70 - 99 mg/dL   BUN 34 (H) 6 - 23 mg/dL   Creatinine, Ser 7.24 (H) 0.50 - 1.10 mg/dL   Calcium 8.8 8.4 - 10.5 mg/dL   GFR calc non Af Amer 5 (L) >90 mL/min   GFR calc Af Amer 6 (L) >90 mL/min    Comment: (NOTE) The eGFR has been calculated using the CKD EPI equation. This calculation has not been validated in all clinical situations. eGFR's persistently <90 mL/min signify possible Chronic Kidney Disease.    Anion gap 19 (H) 5 - 15  CBC     Status: Abnormal   Collection Time: 04/19/14  3:10 AM  Result Value Ref Range   WBC 9.6 4.0 - 10.5 K/uL   RBC 3.72 (L) 3.87 - 5.11 MIL/uL   Hemoglobin 9.8 (L) 12.0 - 15.0 g/dL   HCT 30.9 (L) 36.0 - 46.0 %   MCV 83.1 78.0 - 100.0 fL   MCH 26.3 26.0 - 34.0 pg   MCHC 31.7 30.0 - 36.0 g/dL   RDW 19.2 (H) 11.5 - 15.5 %   Platelets 135 (L) 150 - 400 K/uL    Comment: REPEATED TO VERIFY PLATELET COUNT CONFIRMED BY SMEAR   Basic metabolic panel     Status: Abnormal   Collection Time: 04/19/14  3:10  AM  Result Value Ref Range  Sodium 142 137 - 147 mEq/L   Potassium 3.5 (L) 3.7 - 5.3 mEq/L   Chloride 99 96 - 112 mEq/L   CO2 24 19 - 32 mEq/L   Glucose, Bld 125 (H) 70 - 99 mg/dL   BUN 34 (H) 6 - 23 mg/dL   Creatinine, Ser 7.24 (H) 0.50 - 1.10 mg/dL   Calcium 8.3 (L) 8.4 - 10.5 mg/dL   GFR calc non Af Amer 5 (L) >90 mL/min   GFR calc Af Amer 6 (L) >90 mL/min    Comment: (NOTE) The eGFR has been calculated using the CKD EPI equation. This calculation has not been validated in all clinical situations. eGFR's persistently <90 mL/min signify possible Chronic Kidney Disease.    Anion gap 19 (H) 5 - 15   .  ROS:  See hpi  For pos.  Reports Colonoscopy " Last month for Constipation / diarrhea  5 polyps negative forCA"  Physical Exam: Filed Vitals:   04/19/14 1029  BP: 96/48  Pulse: 73  Temp: 98 F (36.7 C)  Resp: 17     General: Alert Obese bf ,nad, pleasant HEENT: [place] non icteric, mmm Neck: no jvd Heart: Irreg, irreg vr 90/ 1/6 sem lsb, no rub, or gallop Lungs: CTA Abdomen:  Obese bs pos,  Soft nt, nd Extremities: no pedal edema Skin: no overt rash or pedal ulcers/ L Groin  Bandaged not removed dry / clean now Neuro: alert. Ox4 moves all extrm  GriPs = normal Dialysis Access: L fem AVGG pos Bruit with Wound dressing / R fem perm cath nontender/ bilat. Upper arms hd accesses with out sign s of infection  Dialysis Orders: Center[facility] . EDW 78kg HD Bath 2.0 k, 2.0 ca  Time 4hrs Heparin 4000u. Access R fem perm cath / L fem avgg      Calcitriol 0.5 mcg po/HD  Aranesp 132mhg q weeky/HD  Venofer  572mw thurs hd   Assessment/Plan 1. L fem avgg infection -Dr. FiOneida Alarollowing on IV Vancomycin and Zosyn with ? pcn allergy 2. ESRD - hd today  Use 4.0 k bath with  3.5 k 3. Hypertension/volume  - on midodrine 20m78mbp lowish / cxr  Without excess vol / wt per bed 78 at her edw/ on BB for A fib. 4. Anemia  - hgb 9.8 continue esa and fe  5. Metabolic bone disease -  On po  vit d / binder=Phos lo  was stopped as op per prior med list  Ck  Ca / phos in hosp 6. A. Fib/ CAD . - VR currently stable ( no t coumadin now per her history0  ,on plavix, BB   DavErnest HaberA-C [facility] Beeper [id number]/05/2013, 12:33 PM  I have seen and examined this patient and agree with plan per DavErnest HaberPt seen and examined.  Suspect infection of Lt thigh AVG +/- bacteremia.  She is allergic to PCN and pharmacy notified to not give zosyn and use a different med for gm - coverage.  Will plan HD tonight.  She says her BP tends to run in the 90's -100.. MMarland KitchenTTINGLY,Meganne Rita T,MD 04/19/2014 3:29 PM

## 2014-04-20 LAB — CBC
HCT: 29.8 % — ABNORMAL LOW (ref 36.0–46.0)
HEMOGLOBIN: 9.3 g/dL — AB (ref 12.0–15.0)
MCH: 25.9 pg — AB (ref 26.0–34.0)
MCHC: 31.2 g/dL (ref 30.0–36.0)
MCV: 83 fL (ref 78.0–100.0)
PLATELETS: 102 10*3/uL — AB (ref 150–400)
RBC: 3.59 MIL/uL — ABNORMAL LOW (ref 3.87–5.11)
RDW: 20 % — ABNORMAL HIGH (ref 11.5–15.5)
WBC: 18.5 10*3/uL — ABNORMAL HIGH (ref 4.0–10.5)

## 2014-04-20 LAB — RENAL FUNCTION PANEL
Albumin: 2.5 g/dL — ABNORMAL LOW (ref 3.5–5.2)
Anion gap: 16 — ABNORMAL HIGH (ref 5–15)
BUN: 20 mg/dL (ref 6–23)
CALCIUM: 8 mg/dL — AB (ref 8.4–10.5)
CO2: 25 mEq/L (ref 19–32)
Chloride: 100 mEq/L (ref 96–112)
Creatinine, Ser: 4.54 mg/dL — ABNORMAL HIGH (ref 0.50–1.10)
GFR calc Af Amer: 10 mL/min — ABNORMAL LOW (ref >90)
GFR, EST NON AFRICAN AMERICAN: 9 mL/min — AB (ref >90)
Glucose, Bld: 109 mg/dL — ABNORMAL HIGH (ref 70–99)
Phosphorus: 3.1 mg/dL (ref 2.3–4.6)
Potassium: 4.2 mEq/L (ref 3.7–5.3)
Sodium: 141 mEq/L (ref 137–147)

## 2014-04-20 MED ORDER — NEPRO/CARBSTEADY PO LIQD
237.0000 mL | Freq: Three times a day (TID) | ORAL | Status: DC
  Administered 2014-04-20: 237 mL via ORAL
  Filled 2014-04-20 (×5): qty 237

## 2014-04-20 MED ORDER — VANCOMYCIN HCL IN DEXTROSE 750-5 MG/150ML-% IV SOLN
750.0000 mg | INTRAVENOUS | Status: DC
  Administered 2014-04-23 – 2014-05-05 (×6): 750 mg via INTRAVENOUS
  Filled 2014-04-20 (×10): qty 150

## 2014-04-20 MED ORDER — BOOST / RESOURCE BREEZE PO LIQD
1.0000 | Freq: Two times a day (BID) | ORAL | Status: DC
  Administered 2014-04-20 – 2014-05-06 (×16): 1 via ORAL

## 2014-04-20 NOTE — Consult Note (Addendum)
WOC wound consult note Reason for Consult: Consult requested by VVS service to apply Vac dressing to left groin. Wound type: Full thickness  Measurement: 3.5X1.5X1cm Wound bed: 100% yellow slough Drainage (amount, consistency, odor) Large amt yellow drainage with some odor, as soon as it is wiped dry, then fluid returns immediately from within wound bed Periwound: Intact skin surrounding with some stapes intact at edge near 12:00 o'clock site.  Graft palpable underneath skin. Dressing procedure/placement/frequency: Applied Mepitel contact layer to protect graft site, then one piece of black foam to 125mm cont suction.  Barrier ring applied to edges to maintain seal over constant moisture.  Pt denies c/o pain.  Plan dressing change on Friday. Cammie Mcgeeawn Rainna Nearhood MSN, RN, CWOCN, [place]AP, CNS [phone]

## 2014-04-20 NOTE — Progress Notes (Signed)
[facility]  Subjective  - feels ok not eating much   Objective 90/37 94 97.5 F (36.4 C) (Oral) 23 100%  Intake/Output Summary (Last 24 hours) at 04/20/14 0855 Last data filed at 04/19/14 1857  Gross per 24 hour  Intake    100 ml  Output   -130 ml  Net    230 ml   Left groin no erythema but large amount of serous drainage soaking several abd pads  Assessment/Planning: Place VAC dressing today as this still appears like lymphatic leak Protein calorie malnutrition emphasized to pt she needs to eat to heal will supplement with nepro High likelihood this graft will need to be improved early next week if no real change Continue antibiotics  Gevon Markus E 04/20/2014 8:55 AM --  Laboratory Lab Results:  Recent Labs  04/19/14 0310 04/20/14 0700  WBC 9.6 PENDING  HGB 9.8* 9.3*  HCT 30.9* 29.8*  PLT 135* PENDING   BMET  Recent Labs  04/19/14 0310 04/20/14 0513  NA 142 141  K 3.5* 4.2  CL 99 100  CO2 24 25  GLUCOSE 125* 109*  BUN 34* 20  CREATININE 7.24* 4.54*  CALCIUM 8.3* 8.0*    COAG No results found for: INR, PROTIME No results found for: PTT

## 2014-04-20 NOTE — Progress Notes (Signed)
Pt report her new IV site hurting and requesting for IV to be removed stating "I don't want to be infiltrated; I know what infiltration is"; pt had antibiotics infusing which is stopped. Pt IV flushes but no blood returned noted when drawn back; no swelling noted at site. New IV consult order placed. Pt IV saline locked; son remains at bedside. Arabella MerlesP. Amo Namiyah Grantham RN.

## 2014-04-20 NOTE — Progress Notes (Signed)
Pt right forearm IV removed; new IV site placed in by IV team to Left upper arm. P.Amo Starsha Morning RN.

## 2014-04-20 NOTE — Progress Notes (Signed)
[facility] Progress Note   Subjective: stable  Filed Vitals:   04/19/14 1857 04/19/14 1943 04/20/14 0035 04/20/14 0345  BP: 100/37 113/20 87/47 90/37   Pulse: 108 115 86 94  Temp: 97.6 F (36.4 C) 97.6 F (36.4 C) 98 F (36.7 C) 97.5 F (36.4 C)  TempSrc: Oral Oral Oral Oral  Resp: 25 20 21 23   Height:      Weight: 78.2 kg (172 lb 6.4 oz)     SpO2: 99% 100% 100% 100%   Exam General: Alert Obese bf ,nad, pleasant Neck: no jvd Heart: Irreg, irreg vr 90/ 1/6 sem lsb, no rub, or gallop Lungs: CTA Abdomen: Obese bs pos, Soft nt, nd Extremities: no pedal edema Skin: no overt rash or pedal ulcers/ L Groin Bandaged not removed dry / clean now Neuro: alert. Ox4 Dialysis Access: L fem AVGG pos Bruit with Wound dressing / R fem perm cath nontender/ bilat. Upper arms old hd accesses without signs of infection  Dialysis Orders: Center: [facility] on tts . EDW 78kg HD Bath 2.0 k, 2.0 ca Time 4hrs Heparin 4000u. Access R fem perm cath / L fem avgg  Calcitriol 0.5 mcg po/HD Aranesp 100mchg q weeky/HD Venofer 50mg  w thurs hd   Assessment/Plan 1. L fem AVG infection -Dr. Darrick PennaFields following on IV Vancomycin and Zosyn with ? hx pcn allergy 2. ESRD - on HD 3. Hypertension/volume - on midodrine 5mg daily,  bp lowish, cxr neg, at dry wt, on BB for A fib. 4. Anemia - hgb 9.8 continue esa and fe  5. Metabolic bone disease - On po vit d / binder=Phos lo was stopped as op per prior med list Ck Ca / phos in hosp 6. A. Fib/ CAD . - VR currently stable ( no t coumadin now per her history0 ,on plavix, BB  Plan- HD tomorrow using femoral TDC. Abx, LWC.     Vinson Moselleob Orra Nolde MD  pager [phone]    cell [phone]  04/20/2014, 1:22 PM     Recent Labs Lab 04/18/14 2102 04/19/14 0310 04/20/14 0513  NA 142 142 141  K 3.6* 3.5* 4.2  CL 97 99 100  CO2 26 24 25   GLUCOSE 114* 125* 109*  BUN 34* 34* 20  CREATININE 7.24* 7.24* 4.54*  CALCIUM 8.8 8.3* 8.0*  PHOS  --   --   3.1    Recent Labs Lab 04/20/14 0513  ALBUMIN 2.5*    Recent Labs Lab 04/18/14 2102 04/19/14 0310 04/20/14 0700  WBC 9.9 9.6 18.5*  NEUTROABS 7.4  --   --   HGB 9.8* 9.8* 9.3*  HCT 30.8* 30.9* 29.8*  MCV 82.8 83.1 83.0  PLT 153 135* 102*   . aspirin EC  81 mg Oral Q24H  . [START ON 04/21/2014] calcitRIOL  0.5 mcg Oral Q T,Th,Sa-HD  . clonazePAM  1 mg Oral Q24H  . clopidogrel  75 mg Oral Q24H  . docusate sodium  100 mg Oral BID  . DULoxetine  60 mg Oral Q24H  . feeding supplement (NEPRO CARB STEADY)  237 mL Oral TID WC  . [START ON 04/21/2014] ferric gluconate (FERRLECIT/NULECIT) IV  62.5 mg Intravenous Q Thu-HD  . heparin  5,000 Units Subcutaneous 3 times per day  . midodrine  5 mg Oral Q24H  . multivitamin  1 tablet Oral QHS  . pantoprazole  40 mg Oral Q24H  . piperacillin-tazobactam (ZOSYN)  IV  2.25 g Intravenous Q8H  . pravastatin  20 mg Oral Q24H  .  sodium chloride  3 mL Intravenous Q12H  . traZODone  150 mg Oral Q24H  . [START ON 04/23/2014] vancomycin  750 mg Intravenous Q T,Th,Sa-HD  . zonisamide  100 mg Oral TID     sodium chloride, acetaminophen **OR** acetaminophen, ALPRAZolam, bisacodyl, bisacodyl, hydrALAZINE, labetalol, metoprolol, morphine injection, ondansetron, oxyCODONE-acetaminophen, sodium chloride

## 2014-04-20 NOTE — Progress Notes (Addendum)
INITIAL NUTRITION ASSESSMENT  DOCUMENTATION CODES Per approved criteria  -Obesity Unspecified   INTERVENTION: Resource Breeze po BID, each supplement provides 250 kcal and 9 grams of protein Continue RENA-VIT daily RD to follow for nutrition care plan  NUTRITION DIAGNOSIS: Inadequate oral intake related to decreased appetite as evidenced by pt report  Goal: Pt to meet >/= 90% of their estimated nutrition needs   Monitor:  PO & supplemental intake, weight, labs, I/O's  Reason for Assessment: Consult  78 y.o. female  Admitting Dx: groin pain  ASSESSMENT: 78 y.o. year old Female with PMH of ESRD on HD, atrial fibrillation, fibromyalgia and HTN; presented for evaluation of left groin drainage.  Patient reports her appetite has been decreased for "months".  She endorses a "tremendous" amount of weight loss.  Reports a 25-30 lb weight loss in the past 6 months.  She states certain foods she just doesn't have a taste for anymore (ie chicken).   Current PO intake variable at 10-100% per flowsheet records.  Would benefit from addition of oral nutrition supplements.  Pt doesn't care for Nepro, however, likes [employer] Breeze.  RD to order.  No muscle or subcutaneous fat depletion noticed.  Height: Ht Readings from Last 1 Encounters:  04/18/14 5\' 2"  (1.575 m)    Weight: Wt Readings from Last 1 Encounters:  04/19/14 172 lb 6.4 oz (78.2 kg)    Ideal Body Weight: 110 lb  % Ideal Body Weight: 156%  Wt Readings from Last 10 Encounters:  04/19/14 172 lb 6.4 oz (78.2 kg)    Usual Body Weight: ---  % Usual Body Weight: ---  BMI:  Body mass index is 31.52 kg/(m^2).  Estimated Nutritional Needs: Kcal: 1900-2100 Protein: 90-100 gm Fluid: 1200 ml  Skin: left groin macerated skin with serous drainage  Diet Order: Diet renal W/12100mL fluid restriction  EDUCATION NEEDS: -No education needs identified at this time   Intake/Output Summary (Last 24 hours) at 04/20/14  1440 Last data filed at 04/20/14 1300  Gross per 24 hour  Intake    360 ml  Output   -130 ml  Net    490 ml    Labs:   Recent Labs Lab 04/18/14 2102 04/19/14 0310 04/20/14 0513  NA 142 142 141  K 3.6* 3.5* 4.2  CL 97 99 100  CO2 26 24 25   BUN 34* 34* 20  CREATININE 7.24* 7.24* 4.54*  CALCIUM 8.8 8.3* 8.0*  PHOS  --   --  3.1  GLUCOSE 114* 125* 109*    Scheduled Meds: . aspirin EC  81 mg Oral Q24H  . [START ON 04/21/2014] calcitRIOL  0.5 mcg Oral Q T,Th,Sa-HD  . clonazePAM  1 mg Oral Q24H  . clopidogrel  75 mg Oral Q24H  . docusate sodium  100 mg Oral BID  . DULoxetine  60 mg Oral Q24H  . feeding supplement (NEPRO CARB STEADY)  237 mL Oral TID WC  . [START ON 04/21/2014] ferric gluconate (FERRLECIT/NULECIT) IV  62.5 mg Intravenous Q Thu-HD  . heparin  5,000 Units Subcutaneous 3 times per day  . midodrine  5 mg Oral Q24H  . multivitamin  1 tablet Oral QHS  . pantoprazole  40 mg Oral Q24H  . piperacillin-tazobactam (ZOSYN)  IV  2.25 g Intravenous Q8H  . pravastatin  20 mg Oral Q24H  . sodium chloride  3 mL Intravenous Q12H  . traZODone  150 mg Oral Q24H  . [START ON 04/23/2014] vancomycin  750 mg Intravenous Q  T,Th,Sa-HD  . zonisamide  100 mg Oral TID    Continuous Infusions:   Past Medical History  Diagnosis Date  . Renal disorder   . Dialysis patient   . Atrial fibrillation   . Fibromyalgia   . Hypertension     Past Surgical History  Procedure Laterality Date  . Cardiac surgery    . Cholecystectomy    . Abdominal hysterectomy      Maureen ChattersKatie Dalynn Jhaveri, RD, LDN Pager #: [phone] After-Hours Pager #: [phone]

## 2014-04-21 LAB — CBC
HCT: 27.9 % — ABNORMAL LOW (ref 36.0–46.0)
HEMATOCRIT: 28.7 % — AB (ref 36.0–46.0)
HEMOGLOBIN: 9.2 g/dL — AB (ref 12.0–15.0)
Hemoglobin: 8.9 g/dL — ABNORMAL LOW (ref 12.0–15.0)
MCH: 26.3 pg (ref 26.0–34.0)
MCH: 26.9 pg (ref 26.0–34.0)
MCHC: 31.9 g/dL (ref 30.0–36.0)
MCHC: 32.1 g/dL (ref 30.0–36.0)
MCV: 82.3 fL (ref 78.0–100.0)
MCV: 83.9 fL (ref 78.0–100.0)
Platelets: 77 10*3/uL — ABNORMAL LOW (ref 150–400)
Platelets: 74 10*3/uL — ABNORMAL LOW (ref 150–400)
RBC: 3.39 MIL/uL — ABNORMAL LOW (ref 3.87–5.11)
RBC: 3.42 MIL/uL — AB (ref 3.87–5.11)
RDW: 20 % — AB (ref 11.5–15.5)
RDW: 19.9 % — ABNORMAL HIGH (ref 11.5–15.5)
WBC: 13.7 10*3/uL — ABNORMAL HIGH (ref 4.0–10.5)
WBC: 12.3 10*3/uL — ABNORMAL HIGH (ref 4.0–10.5)

## 2014-04-21 LAB — WOUND CULTURE

## 2014-04-21 LAB — ALBUMIN: Albumin: 2.3 g/dL — ABNORMAL LOW (ref 3.5–5.2)

## 2014-04-21 LAB — RENAL FUNCTION PANEL
Albumin: 2.3 g/dL — ABNORMAL LOW (ref 3.5–5.2)
Anion gap: 17 — ABNORMAL HIGH (ref 5–15)
BUN: 28 mg/dL — AB (ref 6–23)
CALCIUM: 8.2 mg/dL — AB (ref 8.4–10.5)
CO2: 23 meq/L (ref 19–32)
CREATININE: 5.72 mg/dL — AB (ref 0.50–1.10)
Chloride: 96 mEq/L (ref 96–112)
GFR calc Af Amer: 8 mL/min — ABNORMAL LOW (ref >90)
GFR calc non Af Amer: 6 mL/min — ABNORMAL LOW (ref >90)
Glucose, Bld: 118 mg/dL — ABNORMAL HIGH (ref 70–99)
Phosphorus: 4.2 mg/dL (ref 2.3–4.6)
Potassium: 4.1 mEq/L (ref 3.7–5.3)
Sodium: 136 mEq/L — ABNORMAL LOW (ref 137–147)

## 2014-04-21 LAB — PREALBUMIN: PREALBUMIN: 7.8 mg/dL — AB (ref 17.0–34.0)

## 2014-04-21 MED ORDER — LIDOCAINE-PRILOCAINE 2.5-2.5 % EX CREA
1.0000 "application " | TOPICAL_CREAM | CUTANEOUS | Status: DC | PRN
  Filled 2014-04-21: qty 5

## 2014-04-21 MED ORDER — PENTAFLUOROPROP-TETRAFLUOROETH EX AERO: 1.0000 "application " | INHALATION_SPRAY | CUTANEOUS | Status: DC | PRN

## 2014-04-21 MED ORDER — HEPARIN SODIUM (PORCINE) 1000 UNIT/ML DIALYSIS: 1000.0000 [IU] | INTRAMUSCULAR | Status: DC | PRN

## 2014-04-21 MED ORDER — MIDODRINE HCL 5 MG PO TABS
ORAL_TABLET | ORAL | Status: AC
  Filled 2014-04-21: qty 1

## 2014-04-21 MED ORDER — SODIUM CHLORIDE 0.9 % IV BOLUS (SEPSIS)
100.0000 mL | Freq: Once | INTRAVENOUS | Status: AC
  Administered 2014-04-21: 100 mL via INTRAVENOUS

## 2014-04-21 MED ORDER — ALTEPLASE 2 MG IJ SOLR
2.0000 mg | Freq: Once | INTRAMUSCULAR | Status: DC | PRN
  Filled 2014-04-21: qty 2

## 2014-04-21 MED ORDER — SODIUM CHLORIDE 0.9 % IV SOLN: 100.0000 mL | INTRAVENOUS | Status: DC | PRN

## 2014-04-21 MED ORDER — LIDOCAINE HCL (PF) 1 % IJ SOLN
5.0000 mL | INTRAMUSCULAR | Status: DC | PRN
Start: 2014-04-21 — End: 2014-04-21

## 2014-04-21 MED ORDER — HEPARIN SODIUM (PORCINE) 1000 UNIT/ML DIALYSIS: 4000.0000 [IU] | Freq: Once | INTRAMUSCULAR | Status: DC

## 2014-04-21 MED ORDER — NEPRO/CARBSTEADY PO LIQD
237.0000 mL | ORAL | Status: DC | PRN
  Filled 2014-04-21: qty 237

## 2014-04-21 NOTE — Procedures (Signed)
I was present at this dialysis session, have reviewed the session itself and made  appropriate changes  Vinson Moselleob Saleena Tamas MD (pgr) [phone]    [phone] 04/21/2014, 8:45 AM

## 2014-04-21 NOTE — Progress Notes (Addendum)
Pt off floor-will check back later this morning.  Doreatha MassedRHYNE, SAMANTHA 04/21/2014 7:54 AM    Left groin still drainjing approx 100-200 cc in canister Still no erythema Continue VAC for now Hopefully drainage will stop Otherwise may need graft removal next week Albumin pending but pt is drinking nepro  Fabienne Brunsharles Fields, MD [facility] Office: [phone] Pager: [phone]

## 2014-04-21 NOTE — Progress Notes (Signed)
[facility] Progress Note   Subjective: no complaints  Filed Vitals:   04/21/14 0631 04/21/14 0749 04/21/14 0800 04/21/14 0830  BP: 86/43 86/37 87/40  88/52  Pulse: 72 86 85 97  Temp:  96.9 F (36.1 C)    TempSrc:  Oral    Resp:  15 20 15   Height:      Weight:  80.1 kg (176 lb 9.4 oz)    SpO2:  100%     Exam General: alert, no distress Neck: no jvd Heart: Irreg, irreg vr 90/ 1/6 sem lsb, no rub, or gallop Lungs: CTA Abdomen: Obese bs pos, Soft nt, nd Extremities: no pedal edema Skin: no overt rash or pedal ulcers/ L Groindressing , wound VAC draining Neuro: alert. Ox4 Dialysis Access: L fem AVGG / R fem perm cath nontender, upper arms old hd accesses without signs of infection  Dialysis: TTS [facility] EDW 78kg HD Bath 2.0 k, 2.0 ca Time 4hrs Heparin 4000u. Access R fem perm cath / L fem avgg  Calcitriol 0.5 mcg po/HD Aranesp 100mchg q weeky/HD Venofer 50mg  w thurs hd   Assessment/Plan 1. L fem AVG infection -Dr. Darrick PennaFields following on IV Vancomycin and Zosyn (? hx pcn allergy) 2. ESRD - on HD 3. Hypertension/volume - on midodrine 5mg daily,  bp low, cxr neg, at dry wt, on BB for A fib. 4. Anemia - hgb 9.8 continue esa and fe  5. Metabolic bone disease - On po vit d / binder=Phos lo was stopped as op per prior med list Ck Ca / phos in hosp 6. A. Fib/ CAD . - VR currently stable ( not coumadin now per her history) ,on plavix, BB  Plan- HD today using femoral TDC. Abx, LWC.     Erika Moselleob Aryella Besecker MD  pager [phone]    cell [phone]  04/21/2014, 8:43 AM     Recent Labs Lab 04/18/14 2102 04/19/14 0310 04/20/14 0513  NA 142 142 141  K 3.6* 3.5* 4.2  CL 97 99 100  CO2 26 24 25   GLUCOSE 114* 125* 109*  BUN 34* 34* 20  CREATININE 7.24* 7.24* 4.54*  CALCIUM 8.8 8.3* 8.0*  PHOS  --   --  3.1    Recent Labs Lab 04/20/14 0513  ALBUMIN 2.5*    Recent Labs Lab 04/18/14 2102  04/20/14 0700 04/21/14 0343 04/21/14 0813  WBC 9.9  < >  18.5* 13.7* PENDING  NEUTROABS 7.4  --   --   --   --   HGB 9.8*  < > 9.3* 8.9* 9.2*  HCT 30.8*  < > 29.8* 27.9* 28.7*  MCV 82.8  < > 83.0 82.3 83.9  PLT 153  < > 102* 77* PENDING  < > = values in this interval not displayed. Marland Kitchen. aspirin EC  81 mg Oral Q24H  . calcitRIOL  0.5 mcg Oral Q T,Th,Sa-HD  . clonazePAM  1 mg Oral Q24H  . clopidogrel  75 mg Oral Q24H  . docusate sodium  100 mg Oral BID  . DULoxetine  60 mg Oral Q24H  . feeding supplement (RESOURCE BREEZE)  1 Container Oral BID BM  . ferric gluconate (FERRLECIT/NULECIT) IV  62.5 mg Intravenous Q Thu-HD  . heparin  5,000 Units Subcutaneous 3 times per day  . midodrine      . midodrine  5 mg Oral Q24H  . multivitamin  1 tablet Oral QHS  . pantoprazole  40 mg Oral Q24H  . piperacillin-tazobactam (ZOSYN)  IV  2.25 g  Intravenous Q8H  . pravastatin  20 mg Oral Q24H  . sodium chloride  3 mL Intravenous Q12H  . traZODone  150 mg Oral Q24H  . [START ON 04/23/2014] vancomycin  750 mg Intravenous Q T,Th,Sa-HD  . zonisamide  100 mg Oral TID     sodium chloride, acetaminophen **OR** acetaminophen, ALPRAZolam, bisacodyl, bisacodyl, hydrALAZINE, labetalol, metoprolol, morphine injection, ondansetron, oxyCODONE-acetaminophen, sodium chloride

## 2014-04-21 NOTE — Progress Notes (Signed)
ANTIBIOTIC CONSULT NOTE   Pharmacy Consult for Vancomycin and Zosyn  Indication: graft infection  Allergies  Allergen Reactions  . Lactose Intolerance (Gi) Diarrhea  . Penicillins Itching   Labs:  Recent Labs  04/18/14 2102 04/19/14 0310 04/20/14 0513 04/20/14 0700 04/21/14 0343 04/21/14 0813  WBC 9.9 9.6  --  18.5* 13.7* 12.3*  HGB 9.8* 9.8*  --  9.3* 8.9* 9.2*  PLT 153 135*  --  102* 77* 74*  CREATININE 7.24* 7.24* 4.54*  --   --   --     Assessment: 78 year old female continues on Vancomycin and Zosyn (started 12/1>)for graft infection Wound VAC now in place Cultures negative to date ESRD -- HD on TThurSat She did not tolerate HD well on 12/1 Tolerating Zosyn despite PCN allergy of itching.  Goal of Therapy:  Vancomycin pre-HD level 15-25  Plan:  Continue Vancomycin 750 mg iv Q HD (next dose Saturday) Continue Zosyn 2.25 grams iv Q 8 hours Continue to follow  Thank you. Okey RegalLisa Khara Renaud, PharmD [phone] Elwin SleightPowell, Aitanna Haubner Kay 04/21/2014,10:49 AM

## 2014-04-22 NOTE — Progress Notes (Signed)
[facility] Progress Note   Subjective: no complaints  Filed Vitals:   04/21/14 1711 04/21/14 2100 04/21/14 2249 04/22/14 0606  BP: 88/48 57/39 82/35  78/37  Pulse:  81  85  Temp:  97.8 F (36.6 C)  98.1 F (36.7 C)  TempSrc:  Oral  Oral  Resp:    18  Height:      Weight:    79.8 kg (175 lb 14.8 oz)  SpO2:  100%  100%   Exam General: alert, no distress Neck: no jvd Heart: Irreg, irreg vr 90/ 1/6 sem lsb, no rub, or gallop Lungs: CTA Abdomen: Obese bs pos, Soft nt, nd Extremities: no pedal edema Skin: no overt rash or pedal ulcers/ L Groindressing , wound VAC draining Neuro: alert. Ox4 Dialysis Access: L fem AVGG / R fem perm cath nontender, upper arms old hd accesses without signs of infection  Dialysis: TTS [facility] 78kg HD Bath 2.0 k, 2.0 ca Time 4hrs Heparin 4000u. Access R fem perm cath / L fem avgg  Calcitriol 0.5 mcg po/HD Aranesp 100mchg q weeky/HD Venofer 50mg  w thurs hd   Assessment: 1. Left fem AVG infection/ lymphocele drainage -Dr. Darrick PennaFields following on IV Vancomycin and Zosyn, wound VAC. BCx's neg and wound cx's multiple organisms, none predominant.  2. ESRD - on HD 3. HTN/ vol - chronic hypotension on midodrine 5mg daily. BP's 80's. No vol excess. On BB for A fib. 4. Anemia - hgb 9.8 continue esa and fe  5. Metabolic bone disease - On po vit d / binder=Phos lo was stopped as op per prior med list Ck Ca / phos in hosp 6. A. Fib/ CAD . - VR currently stable ( not coumadin now per her history) ,on plavix, BB    Plan - HD tomorrow, no fluid off    Vinson Moselleob Artem Bunte MD  pager [phone]    cell [phone]  04/22/2014, 11:28 AM     Recent Labs Lab 04/19/14 0310 04/20/14 0513 04/21/14 0858  NA 142 141 136*  K 3.5* 4.2 4.1  CL 99 100 96  CO2 24 25 23   GLUCOSE 125* 109* 118*  BUN 34* 20 28*  CREATININE 7.24* 4.54* 5.72*  CALCIUM 8.3* 8.0* 8.2*  PHOS  --  3.1 4.2    Recent Labs Lab 04/20/14 0513 04/21/14 0814  04/21/14 0858  ALBUMIN 2.5* 2.3* 2.3*    Recent Labs Lab 04/18/14 2102  04/20/14 0700 04/21/14 0343 04/21/14 0813  WBC 9.9  < > 18.5* 13.7* 12.3*  NEUTROABS 7.4  --   --   --   --   HGB 9.8*  < > 9.3* 8.9* 9.2*  HCT 30.8*  < > 29.8* 27.9* 28.7*  MCV 82.8  < > 83.0 82.3 83.9  PLT 153  < > 102* 77* 74*  < > = values in this interval not displayed. Marland Kitchen. aspirin EC  81 mg Oral Q24H  . calcitRIOL  0.5 mcg Oral Q T,Th,Sa-HD  . clonazePAM  1 mg Oral Q24H  . clopidogrel  75 mg Oral Q24H  . docusate sodium  100 mg Oral BID  . DULoxetine  60 mg Oral Q24H  . feeding supplement (RESOURCE BREEZE)  1 Container Oral BID BM  . ferric gluconate (FERRLECIT/NULECIT) IV  62.5 mg Intravenous Q Thu-HD  . heparin  5,000 Units Subcutaneous 3 times per day  . midodrine  5 mg Oral Q24H  . multivitamin  1 tablet Oral QHS  . pantoprazole  40 mg Oral  Q24H  . piperacillin-tazobactam (ZOSYN)  IV  2.25 g Intravenous Q8H  . pravastatin  20 mg Oral Q24H  . sodium chloride  3 mL Intravenous Q12H  . traZODone  150 mg Oral Q24H  . [START ON 04/23/2014] vancomycin  750 mg Intravenous Q T,Th,Sa-HD  . zonisamide  100 mg Oral TID     sodium chloride, acetaminophen **OR** acetaminophen, ALPRAZolam, bisacodyl, bisacodyl, hydrALAZINE, labetalol, metoprolol, morphine injection, ondansetron, oxyCODONE-acetaminophen, sodium chloride

## 2014-04-22 NOTE — Progress Notes (Signed)
Medicare Important Message given? YES  (If response is "NO", the following Medicare IM given date fields will be blank)  Date Medicare IM given: 04/22/14 Medicare IM given by:  Lynora Dymond  

## 2014-04-22 NOTE — Consult Note (Addendum)
  WOC follow-up: Vac dressing change to left groin.  Dr Darrick PennaFields at bedside to assess site and discuss plan of care with patient. Unchanged in appearance and size from previous assessment.  100% yellow slough with large amt yellow drainage in cannister.Wound bed: 100% yellow slough Periwound: Intact skin surrounding with some stapes intact at edge near 12:00 o'clock site. Graft palpable underneath skin. Dressing procedure/placement/frequency: Applied Mepitel contact layer to protect graft site, then one piece of black foam to 125mm cont suction. Barrier ring applied to edges to maintain seal over constant moisture. Pt denies c/o pain. Plan dressing change on Monday. Cammie Mcgeeawn Mareena Cavan MSN, RN, CWOCN, [place], CNS [phone]

## 2014-04-22 NOTE — Progress Notes (Addendum)
  Progress Note    04/22/2014 7:36 AM Hospital Day 4  Subjective:  Sleepy; states the soreness is a little better  Afebrile 50's-80's systolic HR 80's-90's Afib 100% 3LO2NC  Filed Vitals:   04/22/14 0606  BP: 78/37  Pulse: 85  Temp: 98.1 F (36.7 C)  Resp: 18    Physical Exam: Extremities:  Wound vac with good seal.  + thrill within the graft.   CBC    Component Value Date/Time   WBC 12.3* 04/21/2014 0813   RBC 3.42* 04/21/2014 0813   HGB 9.2* 04/21/2014 0813   HCT 28.7* 04/21/2014 0813   PLT 74* 04/21/2014 0813   MCV 83.9 04/21/2014 0813   MCH 26.9 04/21/2014 0813   MCHC 32.1 04/21/2014 0813   RDW 19.9* 04/21/2014 0813   LYMPHSABS 1.7 04/18/2014 2102   MONOABS 0.8 04/18/2014 2102   EOSABS 0.1 04/18/2014 2102   BASOSABS 0 04/18/2014 2102    BMET    Component Value Date/Time   NA 136* 04/21/2014 0858   K 4.1 04/21/2014 0858   CL 96 04/21/2014 0858   CO2 23 04/21/2014 0858   GLUCOSE 118* 04/21/2014 0858   BUN 28* 04/21/2014 0858   CREATININE 5.72* 04/21/2014 0858   CALCIUM 8.2* 04/21/2014 0858   GFRNONAA 6* 04/21/2014 0858   GFRAA 8* 04/21/2014 0858    INR No results found for: INR   Intake/Output Summary (Last 24 hours) at 04/22/14 0736 Last data filed at 04/22/14 0600  Gross per 24 hour  Intake    410 ml  Output   1608 ml  Net  -1198 ml   Wound Vac output:  200cc/24hr  Wound Cx from 04/19/14: Culture MULTIPLE ORGANISMS PRESENT, NONE PREDOMINANT  Note: NO STAPHYLOCOCCUS AUREUS ISOLATED NO GROUP A STREP (S.PYOGENES) ISOLATED         Assessment/Plan:  78 y.o. female is  S/p thigh graft placement a couple of weeks ago at outside hospital admitted with drainage from wound site Hospital Day 4  -wound vac still with increased drainage.  WBC was better yesterday.  Wound cx with no Staph -pt continues to be afebrile -continue abx, check CBC tomorrow   Doreatha MassedSamantha Rhyne, PA-C [facility] [phone] 04/22/2014 7:36  AM  Addendum Left thigh wound unchanged from two days ago. Wound with yellow fibrinous exudate and active yellow drainage. No surrounding erythema.  Thigh graft with audible bruit. Continue VAC. Dr. Darrick PennaFields to decide plan Monday.   Maris BergerKimberly Trinh, PA-C  Groin wound unchanged still with clear drainage but less Will continue VAC for now.  Will make decision Monday on Home VAC vs graft removal.  Severe protein calorie malnutrition, albumin 2.3 and prealbumin 7.8 both low contributing to lack of wound healing.  Continue to encourage PO intake and Nepro  Fabienne Brunsharles Fields, MD [facility] [place] Office: [phone] Pager: [phone]

## 2014-04-23 LAB — CBC
HEMATOCRIT: 30.4 % — AB (ref 36.0–46.0)
Hemoglobin: 9.7 g/dL — ABNORMAL LOW (ref 12.0–15.0)
MCH: 26.7 pg (ref 26.0–34.0)
MCHC: 31.9 g/dL (ref 30.0–36.0)
MCV: 83.7 fL (ref 78.0–100.0)
Platelets: 87 10*3/uL — ABNORMAL LOW (ref 150–400)
RBC: 3.63 MIL/uL — ABNORMAL LOW (ref 3.87–5.11)
RDW: 19.6 % — AB (ref 11.5–15.5)
WBC: 10.4 10*3/uL (ref 4.0–10.5)

## 2014-04-23 LAB — CLOSTRIDIUM DIFFICILE BY PCR: Toxigenic C. Difficile by PCR: NEGATIVE

## 2014-04-23 MED ORDER — MIDODRINE HCL 5 MG PO TABS
5.0000 mg | ORAL_TABLET | Freq: Two times a day (BID) | ORAL | Status: DC
  Administered 2014-04-23 – 2014-05-06 (×27): 5 mg via ORAL
  Filled 2014-04-23 (×29): qty 1

## 2014-04-23 MED ORDER — SODIUM CHLORIDE 0.9 % IV BOLUS (SEPSIS)
500.0000 mL | Freq: Once | INTRAVENOUS | Status: AC
  Administered 2014-04-23: 500 mL via INTRAVENOUS

## 2014-04-23 NOTE — Procedures (Signed)
I was present at this dialysis session, have reviewed the session itself and made  appropriate changes  Pt alert, no distress on HD.  Wants to go back on her pm midodrine as she thinks it was helping her sleep. Have changed to 5 mg bid.  Lots of VAC drainage still.  Hd today, no fluid off.   Vinson Moselleob Mehar Kirkwood MD ([phone]) [phone]    ([phone]) [phone] 04/23/2014, 8:52 AM '

## 2014-04-23 NOTE — Plan of Care (Signed)
Problem: Phase I Progression Outcomes Goal: Pain controlled with appropriate interventions Outcome: Completed/Met Date Met:  04/23/14 Goal: OOB as tolerated unless otherwise ordered Outcome: Completed/Met Date Met:  04/23/14 Goal: Initial discharge plan identified Outcome: Completed/Met Date Met:  04/23/14

## 2014-04-23 NOTE — Progress Notes (Signed)
    Subjective  - POD #5  Complaining of discomfort when she sits up from the wound VAC   Physical Exam:  VAC with good seal\ Graft remains patent       Assessment/Plan:  POD #5  Plan for VAC change on Monday Dialysis per renal  BRABHAM IV, V. WELLS 04/23/2014 5:52 AM --  Filed Vitals:   04/23/14 0524  BP: 89/66  Pulse: 49  Temp: 97.6 F (36.4 C)  Resp:     Intake/Output Summary (Last 24 hours) at 04/23/14 0552 Last data filed at 04/22/14 1900  Gross per 24 hour  Intake    520 ml  Output    250 ml  Net    270 ml     Laboratory CBC    Component Value Date/Time   WBC 12.3* 04/21/2014 0813   HGB 9.2* 04/21/2014 0813   HCT 28.7* 04/21/2014 0813   PLT 74* 04/21/2014 0813    BMET    Component Value Date/Time   NA 136* 04/21/2014 0858   K 4.1 04/21/2014 0858   CL 96 04/21/2014 0858   CO2 23 04/21/2014 0858   GLUCOSE 118* 04/21/2014 0858   BUN 28* 04/21/2014 0858   CREATININE 5.72* 04/21/2014 0858   CALCIUM 8.2* 04/21/2014 0858   GFRNONAA 6* 04/21/2014 0858   GFRAA 8* 04/21/2014 0858    COAG No results found for: INR, PROTIME No results found for: PTT  Antibiotics Anti-infectives    Start     Dose/Rate Route Frequency Ordered Stop   04/23/14 1200  vancomycin (VANCOCIN) IVPB 750 mg/150 ml premix     750 mg150 mL/hr over 60 Minutes Intravenous Every T-Th-Sa (Hemodialysis) 04/20/14 0857     04/19/14 1200  vancomycin (VANCOCIN) IVPB 750 mg/150 ml premix  Status:  Discontinued     750 mg150 mL/hr over 60 Minutes Intravenous Every T-Th-Sa (Hemodialysis) 04/19/14 0042 04/20/14 0857   04/19/14 0200  vancomycin (VANCOCIN) 1,500 mg in sodium chloride 0.9 % 500 mL IVPB     1,500 mg250 mL/hr over 120 Minutes Intravenous  Once 04/19/14 0042 04/19/14 0310   04/19/14 0200  piperacillin-tazobactam (ZOSYN) IVPB 2.25 g     2.25 g100 mL/hr over 30 Minutes Intravenous Every 8 hours 04/19/14 0042     04/19/14 0020  vancomycin (VANCOCIN) IVPB 1000 mg/200 mL  premix  Status:  Discontinued     1,000 mg200 mL/hr over 60 Minutes Intravenous Every 12 hours 04/19/14 0021 04/19/14 0042   04/19/14 0020  piperacillin-tazobactam (ZOSYN) IVPB 3.375 g  Status:  Discontinued     3.375 g12.5 mL/hr over 240 Minutes Intravenous 3 times per day 04/19/14 0021 04/19/14 0033       V. Charlena CrossWells Brabham IV, M.D. [facility] [place] Office: [phone] Pager:  [phone]

## 2014-04-23 NOTE — Plan of Care (Signed)
Problem: Phase II Progression Outcomes Goal: Progress activity as tolerated unless otherwise ordered Outcome: Completed/Met Date Met:  04/23/14     

## 2014-04-23 NOTE — Progress Notes (Signed)
Patient BP 67/24 in right leg. Patient feels lightheaded and fatigue. MD notified. Orders placed. 500 0.9% NS bolus. Will continue to monitor.   Valinda HoarLexie Jhana Giarratano RN

## 2014-04-24 NOTE — Plan of Care (Signed)
Problem: Phase I Progression Outcomes Goal: Other Phase I Outcomes/Goals Outcome: Completed/Met Date Met:  04/24/14

## 2014-04-24 NOTE — Progress Notes (Signed)
See  Vital signs flow sheet . Patient did fine and did not need fluid bolus tonight.

## 2014-04-24 NOTE — Progress Notes (Addendum)
[facility] [place]  Subjective  - She is doing ok over all.    Objective 92/46 85 98.3 F (36.8 C) (Oral) 18 100%  Intake/Output Summary (Last 24 hours) at 04/24/14 0914 Last data filed at 04/24/14 [id number]  Gross per 24 hour  Intake    940 ml  Output   -118 ml  Net   1058 ml    Doppler signal in left thigh graft-patent Doppler PT/DP signals biphasic Left groin wound vac in place with good seal.    Assessment/Planning: Left thigh wound s/p AV graft creation in Nov. [facility]. Will make decision Monday on Home VAC vs graft removal.    Thomasena EdisCOLLINS, EMMA [facility] Center[facility] 04/24/2014 9:14 AM --  Laboratory Lab Results:  Recent Labs  04/23/14 0503  WBC 10.4  HGB 9.7*  HCT 30.4*  PLT 87*   BMET No results for input(s): NA, K, CL, CO2, GLUCOSE, BUN, CREATININE, CALCIUM in the last 72 hours.  COAG No results found for: INR, PROTIME No results found for: PTT    I agree with the above.  I have seen and examined the patient. Plan for wound VAC change tomorrow.  Good thrill within left thigh graft  Durene CalWells Acheron Sugg

## 2014-04-24 NOTE — Plan of Care (Signed)
Problem: Phase III Progression Outcomes Goal: Pain controlled on oral analgesia Outcome: Completed/Met Date Met:  04/24/14 Goal: Voiding independently Outcome: Not Applicable Date Met:  98/06/99 Goal: IV/normal saline lock discontinued Outcome: Not Applicable Date Met:  96/72/27 Goal: Foley discontinued Outcome: Not Applicable Date Met:  73/75/05

## 2014-04-24 NOTE — Progress Notes (Signed)
ANTIBIOTIC CONSULT NOTE   Pharmacy Consult for Vancomycin and Zosyn  Indication: graft infection  Allergies  Allergen Reactions  . Lactose Intolerance (Gi) Diarrhea  . Penicillins Itching   Labs:  Recent Labs  04/23/14 0503  WBC 10.4  HGB 9.7*  PLT 87*    Assessment: 78 year old female continues on Vancomycin and Zosyn (started 12/1>)for graft infection Wound VAC now in place Cultures negative to date ESRD -- HD on TThurSat Lower BFR during HD due to low blood pressures.  Tolerating Zosyn despite PCN allergy of itching.  Goal of Therapy:  Vancomycin pre-HD level 15-25  Plan:  Continue Vancomycin 750 mg iv Q HD (next dose Saturday) Continue Zosyn 2.25 grams iv Q 8 hours Plan for Pre-HD level Tuesday pending antibiotic plan.   Link SnufferJessica Betty Daidone, PharmD, BCPS Clinical Pharmacist [phone] 04/24/2014,1:34 PM

## 2014-04-24 NOTE — Progress Notes (Addendum)
[facility] Progress Note   Subjective: no complaints  Filed Vitals:   04/23/14 2034 04/23/14 2204 04/24/14 0022 04/24/14 0508  BP: 74/30 87/20 117/79 92/46  Pulse:   109 85  Temp:    98.3 F (36.8 C)  TempSrc:    Oral  Resp:    18  Height:      Weight:    81 kg (178 lb 9.2 oz)  SpO2:   100% 100%   Exam General: alert, no distress Neck: no jvd Heart: Irreg, irreg vr 90/ 1/6 sem lsb, no rub, or gallop Lungs: CTA Abdomen: Obese bs pos, Soft nt, nd Extremities: no pedal edema Skin: no overt rash or pedal ulcers/ L Groindressing , wound VAC draining Neuro: alert. Ox4 Dialysis Access: L fem AVGG / R fem perm cath nontender, upper arms old hd accesses without signs of infection  Dialysis: TTS [facility] 78kg HD Bath 2.0 k, 2.0 ca Time 4hrs Heparin 4000u. Access R fem perm cath / L fem avgg  Calcitriol 0.5 mcg po/HD Aranesp 100mchg q weeky/HD Venofer 50mg  w thurs hd   Assessment: 1. Left fem AVG drainage / [place] lymph drainage vs infection, Dr. Darrick PennaFields following on IV Vancomycin and Zosyn, wound VAC in place. BCx's neg and wound cx's multiple organisms, none predominant.  Per VVS, to decide tomorrow removal AVG vs home w wound VAC.   2. ESRD - on HD 3. HTN/ vol - chronic hypotension on midodrine 5mg daily > ^'d to 5 mg bid here. BP's 80's. No vol excess. On BB for A fib. 4. Anemia - hgb 9.8 continue esa and fe  5. Metabolic bone disease - On po vit d / binder=Phos lo was stopped as op per prior med list Ck Ca / phos in hosp 6. A. Fib/ CAD . - VR currently stable (not coumadin now per her history) ,on plavix, BB. [place], "medical Rx" per pt, no details available  Plan - HD Tuesday    Vinson Moselleob Brownie Gockel MD  pager [phone]    cell [phone]  04/24/2014, 10:25 AM     Recent Labs Lab 04/19/14 0310 04/20/14 0513 04/21/14 0858  NA 142 141 136*  K 3.5* 4.2 4.1  CL 99 100 96  CO2 24 25 23    GLUCOSE 125* 109* 118*  BUN 34* 20 28*  CREATININE 7.24* 4.54* 5.72*  CALCIUM 8.3* 8.0* 8.2*  PHOS  --  3.1 4.2    Recent Labs Lab 04/20/14 0513 04/21/14 0814 04/21/14 0858  ALBUMIN 2.5* 2.3* 2.3*    Recent Labs Lab 04/18/14 2102  04/21/14 0343 04/21/14 0813 04/23/14 0503  WBC 9.9  < > 13.7* 12.3* 10.4  NEUTROABS 7.4  --   --   --   --   HGB 9.8*  < > 8.9* 9.2* 9.7*  HCT 30.8*  < > 27.9* 28.7* 30.4*  MCV 82.8  < > 82.3 83.9 83.7  PLT 153  < > 77* 74* 87*  < > = values in this interval not displayed. Marland Kitchen. aspirin EC  81 mg Oral Q24H  . calcitRIOL  0.5 mcg Oral Q T,Th,Sa-HD  . clonazePAM  1 mg Oral Q24H  . clopidogrel  75 mg Oral Q24H  . docusate sodium  100 mg Oral BID  . DULoxetine  60 mg Oral Q24H  . feeding supplement (RESOURCE BREEZE)  1 Container Oral BID BM  . ferric gluconate (FERRLECIT/NULECIT) IV  62.5 mg Intravenous  Q Thu-HD  . heparin  5,000 Units Subcutaneous 3 times per day  . midodrine  5 mg Oral BID  . multivitamin  1 tablet Oral QHS  . pantoprazole  40 mg Oral Q24H  . piperacillin-tazobactam (ZOSYN)  IV  2.25 g Intravenous Q8H  . pravastatin  20 mg Oral Q24H  . sodium chloride  3 mL Intravenous Q12H  . traZODone  150 mg Oral Q24H  . vancomycin  750 mg Intravenous Q T,Th,Sa-HD  . zonisamide  100 mg Oral TID     sodium chloride, acetaminophen **OR** acetaminophen, ALPRAZolam, bisacodyl, bisacodyl, metoprolol, morphine injection, ondansetron, oxyCODONE-acetaminophen, sodium chloride

## 2014-04-24 NOTE — Progress Notes (Signed)
Dr. Myra GianottiBrabham paged and made aware of low B.P. In 3 extrem.. Skin warm and dry. Patient voiced no complaints.order to give 500 ml Normal Saline if patient begins to feel bad. Otherwise monitor patient at this time.

## 2014-04-24 NOTE — Plan of Care (Signed)
Problem: Phase II Progression Outcomes Goal: IV changed to normal saline lock Outcome: Completed/Met Date Met:  04/24/14 Goal: Obtain order to discontinue catheter if appropriate Outcome: Not Applicable Date Met:  04/24/14     

## 2014-04-25 LAB — CULTURE, BLOOD (ROUTINE X 2)
Culture: NO GROWTH
Culture: NO GROWTH

## 2014-04-25 LAB — GLUCOSE, CAPILLARY: Glucose-Capillary: 135 mg/dL — ABNORMAL HIGH (ref 70–99)

## 2014-04-25 NOTE — Progress Notes (Addendum)
Assessment: 1. Left fem AVG drainage / [place] - lymph drainage vs infection, has exposed graft therefore needs removal To be removed Wednesday 2. ESRD - on HD next tx Tuesday 3. HTN/ vol - chronic hypotension on midodrine 5mg daily > ^'d to 5 mg bid here. BP's 80's. No vol excess. On BB for A fib. 4. Anemia - hgb 9.8 continue esa and fe  5. Metabolic bone disease - On po vit d / binder=Phos lo was stopped as op per prior med list Ck Ca / phos in hosp 6. A. Fib/ CAD . - VR currently stable (not coumadin now per her history) ,on plavix, BB. [place], "medical Rx" per pt, no details available 7. Right groin PC, Right chest AVGG not used due to high venous pressures and anastomotic stenosis( I spoke with Dr. Zena AmosBrian Seller in [place], Vascular surgeon at [phone] mobile, office [phone])  Subjective: Interval History: Vac removed, AVGG removal planned  Objective: Vital signs in last 24 hours: Temp:  [97.2 F (36.2 C)-97.7 F (36.5 C)] 97.6 F (36.4 C) (12/07 0629) Pulse Rate:  [79-91] 79 (12/07 0629) Resp:  [16-18] 18 (12/07 0629) BP: (98-108)/(47-54) 98/54 mmHg (12/07 0629) SpO2:  [95 %] 95 % (12/07 0629) Weight:  [81.2 kg (179 lb 0.2 oz)] 81.2 kg (179 lb 0.2 oz) (12/07 0629) Weight change: 1.5 kg (3 lb 4.9 oz)  Intake/Output from previous day: 12/06 0701 - 12/07 0700 In: 450 [P.O.:300; IV Piggyback:150] Out: 350 [Drains:350] Intake/Output this shift: Total I/O In: 120 [P.O.:120] Out: -   Head: Normocephalic, without obvious abnormality, atraumatic Chest wall: no tenderness, right chest functioning AVGG GI: soft, non-tender; bowel sounds normal; no masses,  no organomegaly Extremities: no CCE, right groin drainage  Lab Results:  Recent Labs  04/23/14 0503  WBC 10.4  HGB 9.7*  HCT 30.4*  PLT 87*   BMET: No results for input(s): NA, K, CL, CO2, GLUCOSE, BUN, CREATININE, CALCIUM in the last 72 hours. No results for  input(s): PTH in the last 72 hours. Iron Studies: No results for input(s): IRON, TIBC, TRANSFERRIN, FERRITIN in the last 72 hours. Studies/Results: No results found.  Scheduled: . aspirin EC  81 mg Oral Q24H  . calcitRIOL  0.5 mcg Oral Q T,Th,Sa-HD  . clonazePAM  1 mg Oral Q24H  . clopidogrel  75 mg Oral Q24H  . docusate sodium  100 mg Oral BID  . DULoxetine  60 mg Oral Q24H  . feeding supplement (RESOURCE BREEZE)  1 Container Oral BID BM  . ferric gluconate (FERRLECIT/NULECIT) IV  62.5 mg Intravenous Q Thu-HD  . heparin  5,000 Units Subcutaneous 3 times per day  . midodrine  5 mg Oral BID  . multivitamin  1 tablet Oral QHS  . pantoprazole  40 mg Oral Q24H  . piperacillin-tazobactam (ZOSYN)  IV  2.25 g Intravenous Q8H  . pravastatin  20 mg Oral Q24H  . sodium chloride  3 mL Intravenous Q12H  . traZODone  150 mg Oral Q24H  . vancomycin  750 mg Intravenous Q T,Th,Sa-HD  . zonisamide  100 mg Oral TID      LOS: 7 days   Akshay Spang C 04/25/2014,11:39 AM

## 2014-04-25 NOTE — Consult Note (Addendum)
WOC follow-up: Dr Darrick PennaFields in earlier to assess patient and has discontinued Vac.  Hydrogel dressings have been ordered and he plans to take patient to the OR later this week.  Please refer to VVS team for further plan of care. Please re-consult if further assistance is needed.  Thank-you,  Cammie Mcgeeawn Yamilett Anastos MSN, RN, CWOCN, [place], CNS [phone]

## 2014-04-25 NOTE — Progress Notes (Signed)
[facility] [place]  Subjective  - Feels stronger   Objective 98/54 79 97.6 F (36.4 C) (Oral) 18 95%  Intake/Output Summary (Last 24 hours) at 04/25/14 [id number] Last data filed at 04/25/14 [id number]  Gross per 24 hour  Intake    390 ml  Output    200 ml  Net    190 ml   Left groin graft now exposed no bleeding  Assessment/Planning: Exposed graft left thigh Will need graft removal Plan to remove graft on Wednesday Still with severe protein calorie malnutrition making her very high risk for wound problems after graft removal Continue to push Nepro and other PO intake Recheck prealbumin  Newton Frutiger E 04/25/2014 8:06 AM --  Laboratory Lab Results:  Recent Labs  04/23/14 0503  WBC 10.4  HGB 9.7*  HCT 30.4*  PLT 87*   BMET No results for input(s): NA, K, CL, CO2, GLUCOSE, BUN, CREATININE, CALCIUM in the last 72 hours.  COAG No results found for: INR, PROTIME No results found for: PTT

## 2014-04-25 NOTE — Progress Notes (Signed)
Medicare Important Message given? YES  (If response is "NO", the following Medicare IM given date fields will be blank)  Date Medicare IM given: 04/25/14 Medicare IM given by:  Guliana Weyandt  

## 2014-04-26 LAB — RENAL FUNCTION PANEL
Albumin: 2.4 g/dL — ABNORMAL LOW (ref 3.5–5.2)
Anion gap: 18 — ABNORMAL HIGH (ref 5–15)
BUN: 28 mg/dL — ABNORMAL HIGH (ref 6–23)
CHLORIDE: 96 meq/L (ref 96–112)
CO2: 22 meq/L (ref 19–32)
Calcium: 8.5 mg/dL (ref 8.4–10.5)
Creatinine, Ser: 6.27 mg/dL — ABNORMAL HIGH (ref 0.50–1.10)
GFR calc Af Amer: 7 mL/min — ABNORMAL LOW (ref >90)
GFR calc non Af Amer: 6 mL/min — ABNORMAL LOW (ref >90)
GLUCOSE: 108 mg/dL — AB (ref 70–99)
Phosphorus: 5.1 mg/dL — ABNORMAL HIGH (ref 2.3–4.6)
Potassium: 3.8 mEq/L (ref 3.7–5.3)
SODIUM: 136 meq/L — AB (ref 137–147)

## 2014-04-26 LAB — CBC
HCT: 28.2 % — ABNORMAL LOW (ref 36.0–46.0)
HEMOGLOBIN: 9.1 g/dL — AB (ref 12.0–15.0)
MCH: 26.9 pg (ref 26.0–34.0)
MCHC: 32.3 g/dL (ref 30.0–36.0)
MCV: 83.4 fL (ref 78.0–100.0)
PLATELETS: 105 10*3/uL — AB (ref 150–400)
RBC: 3.38 MIL/uL — AB (ref 3.87–5.11)
RDW: 19.8 % — ABNORMAL HIGH (ref 11.5–15.5)
WBC: 9 10*3/uL (ref 4.0–10.5)

## 2014-04-26 LAB — PREALBUMIN: Prealbumin: 11.8 mg/dL — ABNORMAL LOW (ref 17.0–34.0)

## 2014-04-26 LAB — VANCOMYCIN, RANDOM: Vancomycin Rm: 23.2 ug/mL

## 2014-04-26 MED ORDER — LIDOCAINE-PRILOCAINE 2.5-2.5 % EX CREA: 1.0000 "application " | TOPICAL_CREAM | CUTANEOUS | Status: DC | PRN

## 2014-04-26 MED ORDER — NEPRO/CARBSTEADY PO LIQD: 237.0000 mL | ORAL | Status: DC | PRN

## 2014-04-26 MED ORDER — PENTAFLUOROPROP-TETRAFLUOROETH EX AERO: 1.0000 "application " | INHALATION_SPRAY | CUTANEOUS | Status: DC | PRN

## 2014-04-26 MED ORDER — HEPARIN SODIUM (PORCINE) 1000 UNIT/ML DIALYSIS: 4000.0000 [IU] | Freq: Once | INTRAMUSCULAR | Status: DC

## 2014-04-26 MED ORDER — LIDOCAINE HCL (PF) 1 % IJ SOLN: 5.0000 mL | INTRAMUSCULAR | Status: DC | PRN

## 2014-04-26 MED ORDER — ALTEPLASE 2 MG IJ SOLR
2.0000 mg | Freq: Once | INTRAMUSCULAR | Status: DC | PRN
  Filled 2014-04-26: qty 2

## 2014-04-26 MED ORDER — SODIUM CHLORIDE 0.9 % IV SOLN: 100.0000 mL | INTRAVENOUS | Status: DC | PRN

## 2014-04-26 MED ORDER — ALTEPLASE 2 MG IJ SOLR: 2.0000 mg | Freq: Once | INTRAMUSCULAR | Status: DC | PRN

## 2014-04-26 MED ORDER — HEPARIN SODIUM (PORCINE) 1000 UNIT/ML DIALYSIS: 20.0000 [IU]/kg | INTRAMUSCULAR | Status: DC | PRN

## 2014-04-26 MED ORDER — NEPRO/CARBSTEADY PO LIQD
237.0000 mL | ORAL | Status: DC | PRN
  Filled 2014-04-26: qty 237

## 2014-04-26 MED ORDER — HEPARIN SODIUM (PORCINE) 1000 UNIT/ML DIALYSIS: 1000.0000 [IU] | INTRAMUSCULAR | Status: DC | PRN

## 2014-04-26 MED ORDER — LIDOCAINE-PRILOCAINE 2.5-2.5 % EX CREA
1.0000 "application " | TOPICAL_CREAM | CUTANEOUS | Status: DC | PRN
  Filled 2014-04-26: qty 5

## 2014-04-26 NOTE — Progress Notes (Signed)
Utilization review completed.  

## 2014-04-26 NOTE — Procedures (Addendum)
Tolerating hemodialysis without any instability. [place] Broussard C  

## 2014-04-26 NOTE — Progress Notes (Signed)
   [facility] [place]  Subjective  - She is ok with removing the left thigh graft.   Objective 92/49 94 97.3 F (36.3 C) (Oral) 20 100%  Intake/Output Summary (Last 24 hours) at 04/26/14 0850 Last data filed at 04/26/14 0300  Gross per 24 hour  Intake    360 ml  Output      0 ml  Net    360 ml    Left thigh exposed graft, dressing changed by nursing care this am. Palpable thrill  In the right chest wall graft  Palpable radial pulses equal bil. Feet warm well perfused.  Assessment/Planning: Exposed left thigh graft.  Plan removal tomorrow in the OR by Dr. Darrick PennaFields 04/27/2014 Still with severe protein calorie malnutrition making her very high risk for wound problems after graft removal.  Will continue to give feeding supplements. Prealbumin pending recheck.   Clinton GallantCOLLINS, [facility] 04/26/2014 8:50 AM --  Laboratory Lab Results: No results for input(s): WBC, HGB, HCT, PLT in the last 72 hours. BMET No results for input(s): NA, K, CL, CO2, GLUCOSE, BUN, CREATININE, CALCIUM in the last 72 hours.  COAG No results found for: INR, PROTIME No results found for: PTT

## 2014-04-26 NOTE — Progress Notes (Signed)
ANTIBIOTIC CONSULT NOTE   Pharmacy Consult for Vancomycin and Zosyn  Indication: graft infection  Allergies  Allergen Reactions  . Lactose Intolerance (Gi) Diarrhea  . Penicillins Itching   Labs: No results for input(s): WBC, HGB, PLT, LABCREA, CREATININE in the last 72 hours.  Assessment: 78 year old female continues on Vancomycin and Zosyn (started 12/1>)for graft infection Plan remove graft 12/9 Cultures negative to date ESRD -- HD on TThurSat Lower BFR during HD due to low blood pressures.  Tolerating Zosyn despite PCN allergy of itching. Vanc random level this am 23.2 mcg/ml  Goal of Therapy:  Vancomycin pre-HD level 15-25  Plan:  Continue Vancomycin 750 mg iv Q HD  Continue Zosyn 2.25 grams iv Q 8 hours  Thanks for allowing pharmacy to be a part of this patient's care.  Talbert CageLora Ahamed Hofland, PharmD Clinical Pharmacist, [phone] 04/26/2014,9:57 AM

## 2014-04-26 NOTE — Progress Notes (Signed)
Assessment: 1. Left fem AVG drainage / AVG placed 04/08/14 in [place] - lymph drainage vs infection, has exposed graft therefore needs removal To be removed Wednesday 2. ESRD - HD today 3. HTN/ vol - chronic hypotension on midodrine 5mg daily > ^'d to 5 mg bid here. BP's 80's. No vol excess. On BB for A fib. 4. Anemia - hgb 9.8 continue esa and fe  5. Metabolic bone disease - On po vit d / binder=Phos lo was stopped as op per prior med list Ck Ca / phos in hosp 6. A. Fib/ CAD . - VR currently stable (not coumadin now per her history) ,on plavix, BB. Had heart cath recently in [place], "medical Rx" per pt, no details available 7. Right groin PC, Right chest AVGG not used due to high venous pressures and anastomotic stenosis( I spoke with Dr. Zena AmosBrian Seller in [place], Vascular surgeon at [phone] mobile, office [phone])   We need to reevaluate the chest AVGG to see if there is another option for salvage of this access. D/W Dr Darrick PennaFields yesterday.  Subjective: Interval History: none.  Objective: Vital signs in last 24 hours: Temp:  [97.3 F (36.3 C)-98 F (36.7 C)] 97.3 F (36.3 C) (12/08 0404) Pulse Rate:  [69-94] 69 (12/08 1015) Resp:  [18-20] 18 (12/08 1015) BP: (92-105)/(36-50) 101/36 mmHg (12/08 1015) SpO2:  [100 %] 100 % (12/08 0404) Weight change:   Intake/Output from previous day: 12/07 0701 - 12/08 0700 In: 360 [P.O.:360] Out: -  Intake/Output this shift: Total I/O In: 120 [P.O.:120] Out: -   General appearance: alert and cooperative Chest wall: no tenderness, loop AVGG right chest with thrill Extremities: edema none  Lab Results: No results for input(s): WBC, HGB, HCT, PLT in the last 72 hours. BMET: No results for input(s): NA, K, CL, CO2, GLUCOSE, BUN, CREATININE, CALCIUM in the last 72 hours. No results for input(s): PTH in the last 72 hours. Iron Studies: No results for input(s): IRON, TIBC, TRANSFERRIN, FERRITIN in the last 72 hours. Studies/Results: No  results found.  Scheduled: . aspirin EC  81 mg Oral Q24H  . calcitRIOL  0.5 mcg Oral Q T,Th,Sa-HD  . clonazePAM  1 mg Oral Q24H  . clopidogrel  75 mg Oral Q24H  . docusate sodium  100 mg Oral BID  . DULoxetine  60 mg Oral Q24H  . feeding supplement (RESOURCE BREEZE)  1 Container Oral BID BM  . ferric gluconate (FERRLECIT/NULECIT) IV  62.5 mg Intravenous Q Thu-HD  . heparin  5,000 Units Subcutaneous 3 times per day  . midodrine  5 mg Oral BID  . multivitamin  1 tablet Oral QHS  . pantoprazole  40 mg Oral Q24H  . piperacillin-tazobactam (ZOSYN)  IV  2.25 g Intravenous Q8H  . pravastatin  20 mg Oral Q24H  . sodium chloride  3 mL Intravenous Q12H  . traZODone  150 mg Oral Q24H  . vancomycin  750 mg Intravenous Q T,Th,Sa-HD  . zonisamide  100 mg Oral TID     LOS: 8 days   Bobby Ragan C 04/26/2014,10:27 AM

## 2014-04-27 ENCOUNTER — Inpatient Hospital Stay (HOSPITAL_COMMUNITY): Payer: Medicare HMO | Admitting: Anesthesiology

## 2014-04-27 ENCOUNTER — Inpatient Hospital Stay (HOSPITAL_COMMUNITY): Payer: Medicare HMO

## 2014-04-27 ENCOUNTER — Encounter (HOSPITAL_COMMUNITY)
Admission: EM | Disposition: A | Discharge status: Home Health | Payer: Medicare HMO | Source: Home / Self Care | Attending: Vascular Surgery

## 2014-04-27 ENCOUNTER — Encounter (HOSPITAL_COMMUNITY): Payer: Self-pay | Admitting: Anesthesiology

## 2014-04-27 DIAGNOSIS — T82898A Other specified complication of vascular prosthetic devices, implants and grafts, initial encounter: Secondary | ICD-10-CM

## 2014-04-27 HISTORY — PX: FISTULOGRAM: SHX5832

## 2014-04-27 HISTORY — PX: CARDIAC CATHETERIZATION: SHX172

## 2014-04-27 HISTORY — PX: AVGG REMOVAL: SHX5153

## 2014-04-27 LAB — BASIC METABOLIC PANEL
ANION GAP: 16 — AB (ref 5–15)
Anion gap: 16 — ABNORMAL HIGH (ref 5–15)
BUN: 13 mg/dL (ref 6–23)
BUN: 15 mg/dL (ref 6–23)
CHLORIDE: 102 meq/L (ref 96–112)
CHLORIDE: 100 meq/L (ref 96–112)
CO2: 24 mEq/L (ref 19–32)
CO2: 25 meq/L (ref 19–32)
Calcium: 8.2 mg/dL — ABNORMAL LOW (ref 8.4–10.5)
Calcium: 8.3 mg/dL — ABNORMAL LOW (ref 8.4–10.5)
Creatinine, Ser: 3.85 mg/dL — ABNORMAL HIGH (ref 0.50–1.10)
Creatinine, Ser: 4.23 mg/dL — ABNORMAL HIGH (ref 0.50–1.10)
GFR calc Af Amer: 12 mL/min — ABNORMAL LOW (ref >90)
GFR calc Af Amer: 11 mL/min — ABNORMAL LOW (ref >90)
GFR calc non Af Amer: 10 mL/min — ABNORMAL LOW (ref >90)
GFR calc non Af Amer: 9 mL/min — ABNORMAL LOW (ref >90)
Glucose, Bld: 109 mg/dL — ABNORMAL HIGH (ref 70–99)
Glucose, Bld: 88 mg/dL (ref 70–99)
POTASSIUM: 3.6 meq/L — AB (ref 3.7–5.3)
Potassium: 3.5 mEq/L — ABNORMAL LOW (ref 3.7–5.3)
SODIUM: 141 meq/L (ref 137–147)
Sodium: 142 mEq/L (ref 137–147)

## 2014-04-27 LAB — CBC
HCT: 27.6 % — ABNORMAL LOW (ref 36.0–46.0)
HCT: 34.4 % — ABNORMAL LOW (ref 36.0–46.0)
HEMOGLOBIN: 11 g/dL — AB (ref 12.0–15.0)
Hemoglobin: 8.9 g/dL — ABNORMAL LOW (ref 12.0–15.0)
MCH: 27.1 pg (ref 26.0–34.0)
MCH: 27 pg (ref 26.0–34.0)
MCHC: 32.2 g/dL (ref 30.0–36.0)
MCHC: 32 g/dL (ref 30.0–36.0)
MCV: 84.1 fL (ref 78.0–100.0)
MCV: 84.5 fL (ref 78.0–100.0)
PLATELETS: 88 10*3/uL — AB (ref 150–400)
PLATELETS: 81 10*3/uL — AB (ref 150–400)
RBC: 3.28 MIL/uL — ABNORMAL LOW (ref 3.87–5.11)
RBC: 4.07 MIL/uL (ref 3.87–5.11)
RDW: 20.1 % — ABNORMAL HIGH (ref 11.5–15.5)
RDW: 18.9 % — ABNORMAL HIGH (ref 11.5–15.5)
WBC: 9.2 10*3/uL (ref 4.0–10.5)
WBC: 8.9 10*3/uL (ref 4.0–10.5)

## 2014-04-27 LAB — GLUCOSE, CAPILLARY: Glucose-Capillary: 89 mg/dL (ref 70–99)

## 2014-04-27 LAB — PREPARE RBC (CROSSMATCH)

## 2014-04-27 LAB — ABO/RH: ABO/RH(D): O POS

## 2014-04-27 SURGERY — CENTRAL LINE INSERTION
Anesthesia: General | Site: Thigh | Laterality: Right

## 2014-04-27 MED ORDER — THROMBIN 20000 UNITS EX SOLR
CUTANEOUS | Status: AC
  Filled 2014-04-27: qty 20000

## 2014-04-27 MED ORDER — FENTANYL CITRATE 0.05 MG/ML IJ SOLN
INTRAMUSCULAR | Status: DC | PRN
Start: 2014-04-27 — End: 2014-04-27
  Administered 2014-04-27: 50 ug via INTRAVENOUS

## 2014-04-27 MED ORDER — HYDROMORPHONE HCL 1 MG/ML IJ SOLN: 0.2500 mg | INTRAMUSCULAR | Status: DC | PRN

## 2014-04-27 MED ORDER — PROMETHAZINE HCL 25 MG/ML IJ SOLN: 6.2500 mg | INTRAMUSCULAR | Status: DC | PRN

## 2014-04-27 MED ORDER — NEOSTIGMINE METHYLSULFATE 10 MG/10ML IV SOLN
INTRAVENOUS | Status: DC | PRN
  Administered 2014-04-27: 3 mg via INTRAVENOUS

## 2014-04-27 MED ORDER — SODIUM CHLORIDE 0.9 % IR SOLN
Status: DC | PRN
  Administered 2014-04-27: 500 mL

## 2014-04-27 MED ORDER — PHENYLEPHRINE HCL 10 MG/ML IJ SOLN
20.0000 mg | INTRAVENOUS | Status: DC | PRN
  Administered 2014-04-27: 50 ug/min via INTRAVENOUS

## 2014-04-27 MED ORDER — HYDRALAZINE HCL 20 MG/ML IJ SOLN: 5.0000 mg | INTRAMUSCULAR | Status: DC | PRN

## 2014-04-27 MED ORDER — ASPIRIN EC 81 MG PO TBEC
81.0000 mg | DELAYED_RELEASE_TABLET | ORAL | Status: DC
  Administered 2014-04-28 – 2014-05-05 (×8): 81 mg via ORAL
  Filled 2014-04-27 (×9): qty 1

## 2014-04-27 MED ORDER — ONDANSETRON HCL 4 MG/2ML IJ SOLN
INTRAMUSCULAR | Status: DC | PRN
  Administered 2014-04-27: 4 mg via INTRAVENOUS

## 2014-04-27 MED ORDER — HEPARIN SOD (PORK) LOCK FLUSH 100 UNIT/ML IV SOLN
INTRAVENOUS | Status: AC
  Filled 2014-04-27: qty 5

## 2014-04-27 MED ORDER — SUCCINYLCHOLINE CHLORIDE 20 MG/ML IJ SOLN
INTRAMUSCULAR | Status: DC | PRN
  Administered 2014-04-27: 100 mg via INTRAVENOUS

## 2014-04-27 MED ORDER — OXYCODONE HCL 5 MG PO TABS: 5.0000 mg | ORAL_TABLET | Freq: Once | ORAL | Status: DC | PRN

## 2014-04-27 MED ORDER — IODIXANOL 320 MG/ML IV SOLN
INTRAVENOUS | Status: DC | PRN
  Administered 2014-04-27: 1997.5 mL via INTRAVENOUS

## 2014-04-27 MED ORDER — PHENOL 1.4 % MT LIQD: 1.0000 | OROMUCOSAL | Status: DC | PRN

## 2014-04-27 MED ORDER — FENTANYL CITRATE 0.05 MG/ML IJ SOLN
INTRAMUSCULAR | Status: AC
  Filled 2014-04-27: qty 5

## 2014-04-27 MED ORDER — PHENYLEPHRINE HCL 10 MG/ML IJ SOLN
INTRAMUSCULAR | Status: AC
  Filled 2014-04-27: qty 2

## 2014-04-27 MED ORDER — PIPERACILLIN-TAZOBACTAM IN DEX 2-0.25 GM/50ML IV SOLN
2.2500 g | INTRAVENOUS | Status: AC
  Administered 2014-04-27: 2.25 g via INTRAVENOUS
  Filled 2014-04-27: qty 50

## 2014-04-27 MED ORDER — ETOMIDATE 2 MG/ML IV SOLN
INTRAVENOUS | Status: AC
  Filled 2014-04-27: qty 10

## 2014-04-27 MED ORDER — ALBUMIN HUMAN 5 % IV SOLN
INTRAVENOUS | Status: DC | PRN
  Administered 2014-04-27: 11:00:00 via INTRAVENOUS

## 2014-04-27 MED ORDER — SODIUM CHLORIDE 0.9 % IJ SOLN
INTRAMUSCULAR | Status: AC
  Filled 2014-04-27: qty 10

## 2014-04-27 MED ORDER — PROTAMINE SULFATE 10 MG/ML IV SOLN
INTRAVENOUS | Status: DC | PRN
  Administered 2014-04-27: 70 mg via INTRAVENOUS

## 2014-04-27 MED ORDER — HEPARIN SODIUM (PORCINE) 1000 UNIT/ML IJ SOLN
INTRAMUSCULAR | Status: DC | PRN
  Administered 2014-04-27: 7 mL via INTRAVENOUS

## 2014-04-27 MED ORDER — THROMBIN 20000 UNITS EX KIT
PACK | CUTANEOUS | Status: DC | PRN
  Administered 2014-04-27: 20 mL via TOPICAL

## 2014-04-27 MED ORDER — SODIUM CHLORIDE 0.9 % IV SOLN
INTRAVENOUS | Status: DC
  Administered 2014-04-27: 10:00:00 via INTRAVENOUS
  Administered 2014-05-01: 10 mL/h via INTRAVENOUS

## 2014-04-27 MED ORDER — LABETALOL HCL 5 MG/ML IV SOLN: 10.0000 mg | INTRAVENOUS | Status: DC | PRN

## 2014-04-27 MED ORDER — ROCURONIUM BROMIDE 100 MG/10ML IV SOLN
INTRAVENOUS | Status: DC | PRN
  Administered 2014-04-27: 30 mg via INTRAVENOUS

## 2014-04-27 MED ORDER — SODIUM CHLORIDE 0.9 % IV SOLN: Freq: Once | INTRAVENOUS | Status: DC

## 2014-04-27 MED ORDER — GUAIFENESIN-DM 100-10 MG/5ML PO SYRP: 15.0000 mL | ORAL_SOLUTION | ORAL | Status: DC | PRN

## 2014-04-27 MED ORDER — LIDOCAINE HCL (CARDIAC) 20 MG/ML IV SOLN
INTRAVENOUS | Status: DC | PRN
  Administered 2014-04-27: 50 mg via INTRAVENOUS

## 2014-04-27 MED ORDER — GLYCOPYRROLATE 0.2 MG/ML IJ SOLN
INTRAMUSCULAR | Status: DC | PRN
  Administered 2014-04-27: 0.4 mg via INTRAVENOUS

## 2014-04-27 MED ORDER — OXYCODONE HCL 5 MG/5ML PO SOLN: 5.0000 mg | Freq: Once | ORAL | Status: DC | PRN

## 2014-04-27 MED ORDER — SODIUM CHLORIDE 0.9 % IV SOLN
500.0000 mL | Freq: Once | INTRAVENOUS | Status: AC | PRN
  Administered 2014-04-27: 500 mL via INTRAVENOUS

## 2014-04-27 MED ORDER — POTASSIUM CHLORIDE CRYS ER 20 MEQ PO TBCR: 20.0000 meq | EXTENDED_RELEASE_TABLET | Freq: Every day | ORAL | Status: DC | PRN

## 2014-04-27 MED ORDER — ONDANSETRON HCL 4 MG/2ML IJ SOLN
INTRAMUSCULAR | Status: AC
  Filled 2014-04-27: qty 4

## 2014-04-27 MED ORDER — HEPARIN SODIUM (PORCINE) 5000 UNIT/ML IJ SOLN
INTRAMUSCULAR | Status: DC | PRN
  Administered 2014-04-27: 500 mL

## 2014-04-27 MED ORDER — EPHEDRINE SULFATE 50 MG/ML IJ SOLN
INTRAMUSCULAR | Status: AC
  Filled 2014-04-27: qty 2

## 2014-04-27 MED ORDER — PHENYLEPHRINE 40 MCG/ML (10ML) SYRINGE FOR IV PUSH (FOR BLOOD PRESSURE SUPPORT)
PREFILLED_SYRINGE | INTRAVENOUS | Status: AC
  Filled 2014-04-27: qty 10

## 2014-04-27 MED ORDER — PROPOFOL 10 MG/ML IV BOLUS
INTRAVENOUS | Status: DC | PRN
  Administered 2014-04-27: 90 mg via INTRAVENOUS

## 2014-04-27 MED ORDER — 0.9 % SODIUM CHLORIDE (POUR BTL) OPTIME
TOPICAL | Status: DC | PRN
  Administered 2014-04-27: 1000 mL

## 2014-04-27 SURGICAL SUPPLY — 59 items
CANISTER SUCTION 2500CC (MISCELLANEOUS) ×4 IMPLANT
CANNULA VESSEL 3MM 2 BLNT TIP (CANNULA) IMPLANT
CATH BEACON 5.038 65CM KMP-01 (CATHETERS) ×4 IMPLANT
CLIP TI MEDIUM 6 (CLIP) ×4 IMPLANT
CLIP TI WIDE RED SMALL 6 (CLIP) ×4 IMPLANT
COVER SURGICAL LIGHT HANDLE (MISCELLANEOUS) ×4 IMPLANT
DECANTER SPIKE VIAL GLASS SM (MISCELLANEOUS) IMPLANT
DEVICE TORQUE KENDALL .025-038 (MISCELLANEOUS) ×4 IMPLANT
DRAIN PENROSE 1/4X12 LTX STRL (WOUND CARE) ×4 IMPLANT
DRAPE CHEST BREAST 15X10 FENES (DRAPES) ×4 IMPLANT
DRAPE IMP U-DRAPE 54X76 (DRAPES) ×4 IMPLANT
DRAPE INCISE IOBAN 66X45 STRL (DRAPES) ×4 IMPLANT
ELECT REM PT RETURN 9FT ADLT (ELECTROSURGICAL) ×4
ELECTRODE REM PT RTRN 9FT ADLT (ELECTROSURGICAL) ×3 IMPLANT
GAUZE SPONGE 2X2 8PLY STRL LF (GAUZE/BANDAGES/DRESSINGS) ×3 IMPLANT
GEL ULTRASOUND 20GR AQUASONIC (MISCELLANEOUS) IMPLANT
GLOVE BIO SURGEON STRL SZ 6.5 (GLOVE) ×24 IMPLANT
GLOVE BIO SURGEON STRL SZ7.5 (GLOVE) ×8 IMPLANT
GLOVE BIOGEL PI IND STRL 6.5 (GLOVE) ×9 IMPLANT
GLOVE BIOGEL PI IND STRL 7.0 (GLOVE) ×6 IMPLANT
GLOVE BIOGEL PI INDICATOR 6.5 (GLOVE) ×3
GLOVE BIOGEL PI INDICATOR 7.0 (GLOVE) ×2
GLOVE ECLIPSE 6.5 STRL STRAW (GLOVE) ×12 IMPLANT
GLOVE SURG SS PI 6.5 STRL IVOR (GLOVE) ×8 IMPLANT
GLOVE SURG SS PI 7.0 STRL IVOR (GLOVE) ×4 IMPLANT
GOWN STRL REUS W/ TWL LRG LVL3 (GOWN DISPOSABLE) ×9 IMPLANT
GOWN STRL REUS W/ TWL XL LVL3 (GOWN DISPOSABLE) ×3 IMPLANT
GOWN STRL REUS W/TWL LRG LVL3 (GOWN DISPOSABLE) ×3
GOWN STRL REUS W/TWL XL LVL3 (GOWN DISPOSABLE) ×1
GUIDEWIRE ANGLED .035X150CM (WIRE) ×4 IMPLANT
KIT BASIN OR (CUSTOM PROCEDURE TRAY) ×4 IMPLANT
KIT ROOM TURNOVER OR (KITS) ×4 IMPLANT
LIQUID BAND (GAUZE/BANDAGES/DRESSINGS) IMPLANT
NEEDLE HYPO 25GX1X1/2 BEV (NEEDLE) ×4 IMPLANT
NS IRRIG 1000ML POUR BTL (IV SOLUTION) ×4 IMPLANT
PACK CV ACCESS (CUSTOM PROCEDURE TRAY) IMPLANT
PACK PERIPHERAL VASCULAR (CUSTOM PROCEDURE TRAY) ×4 IMPLANT
PAD ARMBOARD 7.5X6 YLW CONV (MISCELLANEOUS) ×8 IMPLANT
PATCH VASCULAR VASCU GUARD 1X6 (Vascular Products) ×4 IMPLANT
PIN SAFETY STERILE (MISCELLANEOUS) ×4 IMPLANT
PROBE PENCIL 8 MHZ STRL DISP (MISCELLANEOUS) IMPLANT
PROTECTION STATION PRESSURIZED (MISCELLANEOUS) ×4
SET MICROPUNCTURE 5F STIFF (MISCELLANEOUS) ×4 IMPLANT
SPONGE GAUZE 2X2 STER 10/PKG (GAUZE/BANDAGES/DRESSINGS) ×1
SPONGE GAUZE 4X4 12PLY STER LF (GAUZE/BANDAGES/DRESSINGS) ×8 IMPLANT
SPONGE SURGIFOAM ABS GEL 100 (HEMOSTASIS) IMPLANT
STATION PROTECTION PRESSURIZED (MISCELLANEOUS) ×3 IMPLANT
SUT ETHILON 3 0 PS 1 (SUTURE) ×12 IMPLANT
SUT PROLENE 5 0 C 1 24 (SUTURE) ×4 IMPLANT
SUT PROLENE 6 0 CC (SUTURE) ×4 IMPLANT
SUT VIC AB 2-0 CTX 27 (SUTURE) ×8 IMPLANT
SUT VIC AB 3-0 SH 27 (SUTURE) ×1
SUT VIC AB 3-0 SH 27X BRD (SUTURE) ×3 IMPLANT
SUT VICRYL 4-0 PS2 18IN ABS (SUTURE) ×4 IMPLANT
TAPE CLOTH SURG 4X10 WHT LF (GAUZE/BANDAGES/DRESSINGS) ×4 IMPLANT
TOWEL OR 17X26 10 PK STRL BLUE (TOWEL DISPOSABLE) ×4 IMPLANT
UNDERPAD 30X30 INCONTINENT (UNDERPADS AND DIAPERS) ×4 IMPLANT
WATER STERILE IRR 1000ML POUR (IV SOLUTION) ×4 IMPLANT
WIRE BENTSON .035X145CM (WIRE) ×4 IMPLANT

## 2014-04-27 NOTE — Op Note (Signed)
Procedure: #1 insertion of left internal jugular vein intravenous catheter #2 right chest wall shuntogram #3 removal left thigh graft with bovine patch angioplasty left superficial femoral artery  Preoperative diagnosis: #1 limited IV access #2 poorly functioning right chest wall graft #3 infected left thigh graft  Postoperative diagnosis: Same  Anesthesia: Gen.  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 The patient was placed in supine position on operating room table. After induction of general anesthesia and endotracheal intubation, the patient's entire neck and chest was prepped and draped in usual sterile fashion. Next ultrasound was used to identify the left internal jugular vein. This had normal compression. An introducer needle was used to cannulate left internal jugular vein without difficulty. There was high pressure in the jugular venous system. An 035 J-wire was advanced into the left central venous system. A Jelco catheter was placed over this and a central venogram performed. This showed occlusion of the left innominate vein and superior vena cava. There was a large collateralized azygos vein. An 035 angled Glidewire was then placed through the Jelco catheter directed into this azygos vein. The Glidewire was then exchanged over a KMP catheter for an 035 Bentson wire. The triple-lumen catheter was then advanced over the Bentson wire into the azygous system. This was noted to flush and draw easily through all 3 ports. Contrast angiogram was performed to document the catheter location. This was sutured to the skin with a nylon stitch.  Next  attention was turned to the right chest wall graft. A micropuncture needle was used to cannulate the venous limb of the chest wall graft under ultrasound guidance. Several contrast injections were performed which did not show any flow into the central venous system but did show some flow into a portion of the more distal subclavian vein and a large collateral over the shoulder. Presumably the right innominate vein and superior vena cava is occluded.  The micropuncture needle was removed and hemostasis obtained with direct pressure.  Attention was then turned to the left groin. The left groin was prepped and draped in usual sterile fashion. The patient had an open wound with exposed graft. This incision was lengthened slightly proximally. Dissection was carried down on the venous limb of the graft to the level of common femoral vein. This was dissected free circumferentially on the anterior two thirds surface. This was then clamped with a Cooley clamp and the entire venous limb of the graft removed. The vein was then repaired with a running 5-0 Prolene suture. Clamp was removed and there was good hemostasis. The graft was then transected just above the arterial anastomosis. All of the graft from the thigh was then removed. 2 Penrose drains were placed through the tunneled site of the previous graft and brought out on the anterior thigh. They were sutured to the skin and secured with a nylon stitch and a safety pin. Attention was then turned to the arterial anastomosis. Dissection was carried all the way down to the level of the arterial anastomosis which appeared to be on the proximal superficial or distal common femoral vein. I was able to dissect this free circumferentially although there was dense adhesions and inflammation. A large  side branch thought to either be a collateral or small profunda branch was dissected free circumferentially and a vessel loop placed around this. Vessel loops were also placed  proximal and distal to the anastomosis. The patient was given 7000 units of intravenous heparin. Vessel loops were used to control the artery proximally and distally after 2 minutes of circulation time. The remainder of the arterial and the graft was completely removed. A bovine pericardial patch was then brought to the operative field and sewn on as a patch angioplasty using a running 6-0 Prolene suture. Just prior to completion of the anastomosis it was forebled backbled and thoroughly flushed. The anastomosis was secured, clamps released, and there was good pulsatile flow in the artery immediately. The wound was thoroughly irrigated with normal saline solution. Thrombin and gelfoam was used to obtain hemostasis along the edge of the patch. The patient was also given 70 mg of protamine. Next the deep layers and groin were closed with interrupted 2-0 Vicryl sutures. A running 3-0 Vicryl suture stitch was placed in the subcutaneous tissue. The skin was left open except for the very most inferior portion incision which was closed with 2 nylon sutures. Patient tolerated procedure well and there were no complications. The instrument sponge and needle count was correct at the end of the case. The patient was taken to the recovery room in stable condition.  Fabienne Brunsharles Fields, MD [facility] Office: [phone] Pager: [phone]

## 2014-04-27 NOTE — Progress Notes (Signed)
NUTRITION FOLLOW UP  DOCUMENTATION CODES Per approved criteria  -Obesity Unspecified   Intervention: Continue Resource Breeze po BID, each supplement provides 250 kcal and 9 grams of protein Continue RENA-VIT daily RD to follow for nutrition care plan  Nutrition Dx: Inadequate oral intake, resolved  New Nutrition Dx: Increased nutrient needs related to ESRD on HD as evidenced by estimated nutrition needs, ongoing  Goal: Pt to meet >/= 90% of their estimated nutrition needs, met  Monitor:  PO & supplemental intake, weight, labs, I/O's  ASSESSMENT: 78 y.o. year old Female with PMH of ESRD on HD, atrial fibrillation, fibromyalgia and HTN; presented for evaluation of left groin drainage.  Patient currently in OR for evaluation of the chest AVGG.  Patient has been on a Renal diet with 1200 ml fluid restriction.  S/p HD yesterday.  PO intake has been good at 100% per flowsheet records.  Receiving Resource Breeze supplements.  CWOCN notes reviewed.  Height: Ht Readings from Last 1 Encounters:  04/18/14 5' 2"  (1.575 m)    Weight: Wt Readings from Last 1 Encounters:  04/26/14 176 lb 2.4 oz (79.9 kg)    BMI:  Body mass index is 32.21 kg/(m^2).  Estimated Nutritional Needs: Kcal: 1900-2100 Protein: 90-100 gm Fluid: 1200 ml  Skin: left groin macerated skin with serous drainage  Diet Order: Diet NPO time specified Except for: Sips with Meds   Intake/Output Summary (Last 24 hours) at 04/27/14 1149 Last data filed at 04/27/14 1049  Gross per 24 hour  Intake    370 ml  Output   1850 ml  Net  -1480 ml    Labs:   Recent Labs Lab 04/21/14 0858 04/26/14 1049 04/27/14 0340  NA 136* 136* 142  K 4.1 3.8 3.6*  CL 96 96 102  CO2 23 22 24   BUN 28* 28* 13  CREATININE 5.72* 6.27* 3.85*  CALCIUM 8.2* 8.5 8.2*  PHOS 4.2 5.1*  --   GLUCOSE 118* 108* 109*    Scheduled Meds: . sodium chloride   Intravenous Once  . [MAR Hold] aspirin EC  81 mg Oral Q24H  . [MAR Hold]  calcitRIOL  0.5 mcg Oral Q T,Th,Sa-HD  . [MAR Hold] clonazePAM  1 mg Oral Q24H  . [MAR Hold] clopidogrel  75 mg Oral Q24H  . [MAR Hold] docusate sodium  100 mg Oral BID  . [MAR Hold] DULoxetine  60 mg Oral Q24H  . [MAR Hold] feeding supplement (RESOURCE BREEZE)  1 Container Oral BID BM  . [MAR Hold] ferric gluconate (FERRLECIT/NULECIT) IV  62.5 mg Intravenous Q Thu-HD  . [MAR Hold] heparin  5,000 Units Subcutaneous 3 times per day  . [MAR Hold] midodrine  5 mg Oral BID  . [MAR Hold] multivitamin  1 tablet Oral QHS  . [MAR Hold] pantoprazole  40 mg Oral Q24H  . [MAR Hold] piperacillin-tazobactam (ZOSYN)  IV  2.25 g Intravenous Q8H  . [MAR Hold] pravastatin  20 mg Oral Q24H  . [MAR Hold] sodium chloride  3 mL Intravenous Q12H  . [MAR Hold] traZODone  150 mg Oral Q24H  . [MAR Hold] vancomycin  750 mg Intravenous Q T,Th,Sa-HD  . [MAR Hold] zonisamide  100 mg Oral TID    Continuous Infusions: . sodium chloride 20 mL/hr at 04/27/14 2122    Past Medical History  Diagnosis Date  . Renal disorder   . Dialysis patient   . Atrial fibrillation   . Fibromyalgia   . Hypertension     Past  Surgical History  Procedure Laterality Date  . Cardiac surgery    . Cholecystectomy    . Abdominal hysterectomy      Arthur Holms, RD, LDN Pager #: [phone] After-Hours Pager #: [phone]

## 2014-04-27 NOTE — Anesthesia Postprocedure Evaluation (Signed)
  Anesthesia Post-op Note  Patient: Erika Maddox  Procedure(s) Performed: Procedure(s): CENTRAL LINE INSERTION (Left) SHUNTOGRAM OF RIGHT CHEST ARTERIOVENOUS GRAFT (Right) REMOVAL OF ARTERIOVENOUS GORETEX GRAFT (AVGG) (Left)  Patient Location: PACU  Anesthesia Type:General  Level of Consciousness: awake and sedated  Airway and Oxygen Therapy: Patient Spontanous Breathing  Post-op Pain: mild  Post-op Assessment: Post-op Vital signs reviewed  Post-op Vital Signs: stable  Last Vitals:  Filed Vitals:   04/27/14 1450  BP:   Pulse:   Temp:   Resp: 20    Complications: No apparent anesthesia complications

## 2014-04-27 NOTE — OR Nursing (Signed)
Central Line inserted into the azygos by Dr. Darrick PennaFields

## 2014-04-27 NOTE — Progress Notes (Signed)
Patient's belongings collected by patient's RN. Cash [phone]($23) and patient's glasses were in the bedside table; these items were bagged and labeled and handed to the patient's new nurse. Another RN counted and verified the amount of money and glasses.  Other belongings were collected and all were brought to the new room on 2 S. Report was given to receiving RN.    Edgardo RoysMcGrath, Baljit Liebert R

## 2014-04-27 NOTE — Anesthesia Preprocedure Evaluation (Addendum)
Anesthesia Evaluation  Patient identified by MRN, date of birth, ID band Patient awake    Reviewed: Allergy & Precautions, H&P , NPO status , Patient's Chart, lab work & pertinent test results  Airway Mallampati: II   Neck ROM: Full    Dental  (+) Missing, Poor Dentition, Dental Advisory Given   Pulmonary  breath sounds clear to auscultation        Cardiovascular hypertension, Pt. on medications Rhythm:Irregular     Neuro/Psych    GI/Hepatic   Endo/Other    Renal/GU Renal disease     Musculoskeletal  (+) Fibromyalgia -  Abdominal   Peds  Hematology  (+) anemia ,   Anesthesia Other Findings   Reproductive/Obstetrics                            Anesthesia Physical Anesthesia Plan  ASA: III  Anesthesia Plan: General   Post-op Pain Management:    Induction: Intravenous  Airway Management Planned: Oral ETT and LMA  Additional Equipment:   Intra-op Plan:   Post-operative Plan: Extubation in OR  Informed Consent: I have reviewed the patients History and Physical, chart, labs and discussed the procedure including the risks, benefits and alternatives for the proposed anesthesia with the patient or authorized representative who has indicated his/her understanding and acceptance.   Dental advisory given  Plan Discussed with: CRNA, Anesthesiologist and Surgeon  Anesthesia Plan Comments:         Anesthesia Quick Evaluation

## 2014-04-27 NOTE — Progress Notes (Signed)
eLink Physician-Brief Progress Note Patient Name: Erika GleeMamie Howell Maddox DOB: 08/12/1935 MRN: [id number]   Date of Service  04/27/2014  HPI/Events of Note  Pt with exposed LT thigh graft with drainage.  She has hx of ESRD on HD.  She had #1 insertion of left internal jugular vein intravenous catheter #2 right chest wall shuntogram #3 removal left thigh graft with bovine patch angioplasty left superficial femoral artery.    Hemodynamics stable.   eICU Interventions  No additional interventions at this time.        Naiah Donahoe 04/27/2014, 4:59 PM

## 2014-04-27 NOTE — Transfer of Care (Signed)
Immediate Anesthesia Transfer of Care Note  Patient: Erika Maddox  Procedure(s) Performed: Procedure(s): CENTRAL LINE INSERTION (Left) SHUNTOGRAM OF RIGHT CHEST ARTERIOVENOUS GRAFT (Right) REMOVAL OF ARTERIOVENOUS GORETEX GRAFT (AVGG) (Left)  Patient Location: PACU  Anesthesia Type:General  Level of Consciousness: awake, alert  and oriented  Airway & Oxygen Therapy: Patient Spontanous Breathing and Patient connected to face mask oxygen  Post-op Assessment: Report given to PACU RN  Post vital signs: Reviewed and stable  Complications: No apparent anesthesia complications

## 2014-04-27 NOTE — H&P (View-Only) (Signed)
   [facility]  Subjective  - She is ok with removing the left thigh graft.   Objective 92/49 94 97.3 F (36.3 C) (Oral) 20 100%  Intake/Output Summary (Last 24 hours) at 04/26/14 0850 Last data filed at 04/26/14 0300  Gross per 24 hour  Intake    360 ml  Output      0 ml  Net    360 ml    Left thigh exposed graft, dressing changed by nursing care this am. Palpable thrill  In the right chest wall graft  Palpable radial pulses equal bil. Feet warm well perfused.  Assessment/Planning: Exposed left thigh graft.  Plan removal tomorrow in the OR by Dr. Fields 04/27/2014 Still with severe protein calorie malnutrition making her very high risk for wound problems after graft removal.  Will continue to give feeding supplements. Prealbumin pending recheck.   Clemon Devaul MAUREEN 04/26/2014 8:50 AM --  Laboratory Lab Results: No results for input(s): WBC, HGB, HCT, PLT in the last 72 hours. BMET No results for input(s): NA, K, CL, CO2, GLUCOSE, BUN, CREATININE, CALCIUM in the last 72 hours.  COAG No results found for: INR, PROTIME No results found for: PTT     

## 2014-04-27 NOTE — OR Nursing (Signed)
Pt arrived to pacu from OR in a bed. Received report from DelphiLogan CRNA.  Pulse oximetry readings inconsistent throught the case and here in recovery.  Pt. Denies pain but states she is having a little trouble catching her breath.  When pleth accurate, saturations are 95-96% on simple face mask.  Pt placed with HOB>30 degrees.  She denies SOB after repositioning.

## 2014-04-27 NOTE — Interval H&P Note (Signed)
History and Physical Interval Note:  04/27/2014 10:01 AM  Erika Maddox  has presented today for surgery, with the diagnosis of Complication of Left thigh graft   The various methods of treatment have been discussed with the patient and family. After consideration of risks, benefits and other options for treatment, the patient has consented to  Procedure(s): REMOVAL OF ARTERIOVENOUS GORETEX GRAFT (AVGG) - LEFT THIGH GRAFT, (Left) SHUNTOGRAM OF RIGHT CHEST AVG  (Right) as a surgical intervention .  The patient's history has been reviewed, patient examined, no change in status, stable for surgery.  I have reviewed the patient's chart and labs.  Questions were answered to the patient's satisfaction.     Maryjane Benedict E

## 2014-04-27 NOTE — Progress Notes (Signed)
Assessment: 1. Left fem AVG drainage / AVG placed 04/08/14 in [place] - lymph drainage vs infection, has exposed graft therefore needs removal To be removed Wednesday 2. ESRD - HD Friday 3. HTN/ vol - chronic hypotension on midodrine 5mg daily > ^'d to 5 mg bid here. BP's 80's. No vol excess. On BB for A fib. 4. A. Fib/ CAD . -on plavix, BB. Had heart cath recently in [place], "medical Rx" per pt 5. Right groin PC, Right chest AVGG not used due to high venous pressures and anastomotic stenosis( I spoke with Dr. Zena AmosBrian Seller in [place], Vascular surgeon at [phone] mobile, office [phone]) For OR evaluation of the chest AVGG to see if there is another option for salvage of this access; and removal of groin AVG today.  Subjective: Interval History: No new issues  Objective: Vital signs in last 24 hours: Temp:  [97.3 F (36.3 C)-98 F (36.7 C)] 97.9 F (36.6 C) (12/09 0503) Pulse Rate:  [66-98] 82 (12/09 0503) Resp:  [16-20] 16 (12/09 0503) BP: (86-101)/(21-68) 91/68 mmHg (12/09 0503) SpO2:  [98 %-100 %] 99 % (12/09 0503) Weight:  [79.9 kg (176 lb 2.4 oz)-81.9 kg (180 lb 8.9 oz)] 79.9 kg (176 lb 2.4 oz) (12/08 1439) Weight change:   Intake/Output from previous day: 12/08 0701 - 12/09 0700 In: 240 [P.O.:240] Out: 1850  Intake/Output this shift:    General appearance: alert and cooperative Chest wall: no tenderness GI: soft, non-tender; bowel sounds normal; no masses,  no organomegaly Extremities: extremities normal, atraumatic, no cyanosis or edema  Lab Results:  Recent Labs  04/26/14 1049 04/27/14 0340  WBC 9.0 9.2  HGB 9.1* 8.9*  HCT 28.2* 27.6*  PLT 105* 88*   BMET:  Recent Labs  04/26/14 1049 04/27/14 0340  NA 136* 142  K 3.8 3.6*  CL 96 102  CO2 22 24  GLUCOSE 108* 109*  BUN 28* 13  CREATININE 6.27* 3.85*  CALCIUM 8.5 8.2*   No results for input(s): PTH in the last 72 hours. Iron Studies: No results for input(s): IRON, TIBC, TRANSFERRIN, FERRITIN in  the last 72 hours. Studies/Results: No results found.  Scheduled: . [MAR Hold] aspirin EC  81 mg Oral Q24H  . [MAR Hold] calcitRIOL  0.5 mcg Oral Q T,Th,Sa-HD  . [MAR Hold] clonazePAM  1 mg Oral Q24H  . [MAR Hold] clopidogrel  75 mg Oral Q24H  . [MAR Hold] docusate sodium  100 mg Oral BID  . [MAR Hold] DULoxetine  60 mg Oral Q24H  . [MAR Hold] feeding supplement (RESOURCE BREEZE)  1 Container Oral BID BM  . [MAR Hold] ferric gluconate (FERRLECIT/NULECIT) IV  62.5 mg Intravenous Q Thu-HD  . [MAR Hold] heparin  5,000 Units Subcutaneous 3 times per day  . [MAR Hold] midodrine  5 mg Oral BID  . [MAR Hold] multivitamin  1 tablet Oral QHS  . [MAR Hold] pantoprazole  40 mg Oral Q24H  . [MAR Hold] piperacillin-tazobactam (ZOSYN)  IV  2.25 g Intravenous Q8H  . piperacillin-tazobactam  2.25 g Intravenous To OR  . [MAR Hold] pravastatin  20 mg Oral Q24H  . [MAR Hold] sodium chloride  3 mL Intravenous Q12H  . [MAR Hold] traZODone  150 mg Oral Q24H  . [MAR Hold] vancomycin  750 mg Intravenous Q T,Th,Sa-HD  . [MAR Hold] zonisamide  100 mg Oral TID     LOS: 9 days   Antonio Woodhams C 04/27/2014,10:09 AM

## 2014-04-28 LAB — CBC
HCT: 32.8 % — ABNORMAL LOW (ref 36.0–46.0)
Hemoglobin: 10.5 g/dL — ABNORMAL LOW (ref 12.0–15.0)
MCH: 27.1 pg (ref 26.0–34.0)
MCHC: 32 g/dL (ref 30.0–36.0)
MCV: 84.8 fL (ref 78.0–100.0)
Platelets: DECREASED 10*3/uL (ref 150–400)
RBC: 3.87 MIL/uL (ref 3.87–5.11)
RDW: 19.2 % — ABNORMAL HIGH (ref 11.5–15.5)
WBC: 8.6 10*3/uL (ref 4.0–10.5)

## 2014-04-28 LAB — BASIC METABOLIC PANEL
Anion gap: 17 — ABNORMAL HIGH (ref 5–15)
BUN: 15 mg/dL (ref 6–23)
CALCIUM: 8.2 mg/dL — AB (ref 8.4–10.5)
CO2: 21 mEq/L (ref 19–32)
Chloride: 98 mEq/L (ref 96–112)
Creatinine, Ser: 4.38 mg/dL — ABNORMAL HIGH (ref 0.50–1.10)
GFR calc Af Amer: 10 mL/min — ABNORMAL LOW (ref >90)
GFR calc non Af Amer: 9 mL/min — ABNORMAL LOW (ref >90)
Glucose, Bld: 79 mg/dL (ref 70–99)
Potassium: 4.2 mEq/L (ref 3.7–5.3)
Sodium: 136 mEq/L — ABNORMAL LOW (ref 137–147)

## 2014-04-28 LAB — TYPE AND SCREEN
ABO/RH(D): O POS
Antibody Screen: NEGATIVE
UNIT DIVISION: 0
Unit division: 0

## 2014-04-28 LAB — POCT I-STAT 4, (NA,K, GLUC, HGB,HCT)
Glucose, Bld: 116 mg/dL — ABNORMAL HIGH (ref 70–99)
HCT: 40 % (ref 36.0–46.0)
Hemoglobin: 13.6 g/dL (ref 12.0–15.0)
Potassium: 3.8 mEq/L (ref 3.7–5.3)
Sodium: 139 mEq/L (ref 137–147)

## 2014-04-28 LAB — GLUCOSE, CAPILLARY: GLUCOSE-CAPILLARY: 86 mg/dL (ref 70–99)

## 2014-04-28 MED ORDER — NEPRO/CARBSTEADY PO LIQD
237.0000 mL | Freq: Four times a day (QID) | ORAL | Status: DC
  Filled 2014-04-28 (×6): qty 237

## 2014-04-28 MED ORDER — HEPARIN SODIUM (PORCINE) 1000 UNIT/ML DIALYSIS: 1000.0000 [IU] | INTRAMUSCULAR | Status: DC | PRN

## 2014-04-28 MED ORDER — LIDOCAINE HCL (PF) 1 % IJ SOLN: 5.0000 mL | INTRAMUSCULAR | Status: DC | PRN

## 2014-04-28 MED ORDER — PENTAFLUOROPROP-TETRAFLUOROETH EX AERO: 1.0000 "application " | INHALATION_SPRAY | CUTANEOUS | Status: DC | PRN

## 2014-04-28 MED ORDER — SODIUM CHLORIDE 0.9 % IV SOLN: 100.0000 mL | INTRAVENOUS | Status: DC | PRN

## 2014-04-28 MED ORDER — LIDOCAINE-PRILOCAINE 2.5-2.5 % EX CREA
1.0000 "application " | TOPICAL_CREAM | CUTANEOUS | Status: DC | PRN
  Filled 2014-04-28: qty 5

## 2014-04-28 MED ORDER — HEPARIN SODIUM (PORCINE) 1000 UNIT/ML DIALYSIS: 20.0000 [IU]/kg | INTRAMUSCULAR | Status: DC | PRN

## 2014-04-28 MED ORDER — NEPRO/CARBSTEADY PO LIQD
237.0000 mL | ORAL | Status: DC | PRN
  Filled 2014-04-28: qty 237

## 2014-04-28 MED ORDER — ALTEPLASE 2 MG IJ SOLR
2.0000 mg | Freq: Once | INTRAMUSCULAR | Status: AC | PRN
  Filled 2014-04-28: qty 2

## 2014-04-28 MED FILL — Thrombin For Soln 20000 Unit: CUTANEOUS | Qty: 1 | Status: AC

## 2014-04-28 NOTE — Plan of Care (Signed)
Problem: Phase I Progression Outcomes Goal: Pain controlled with appropriate interventions Outcome: Completed/Met Date Met:  04/28/14 Goal: Incision/dressings dry and intact Outcome: Completed/Met Date Met:  04/28/14 Goal: Sutures/staples intact Outcome: Completed/Met Date Met:  04/28/14 Goal: Tubes/drains patent Outcome: Completed/Met Date Met:  04/28/14

## 2014-04-28 NOTE — Progress Notes (Signed)
[facility]  Subjective  - Uneventful night     Objective 104/54 90 97.5 F (36.4 C) (Oral) 14 98%  Intake/Output Summary (Last 24 hours) at 04/28/14 0816 Last data filed at 04/28/14 0600  Gross per 24 hour  Intake   2600 ml  Output    100 ml  Net   2500 ml   Left groin wound and drains putting out copious amount of serosanguinous fluid  Assessment/Planning: Infected left thigh graft removal: follow up cultures.  Leave penrose drains for several days Pt is very high risk for break down of her arterial repair and bleeding due to her severe protein calorie malnutrition Will keep in ICU for now so she can get meticulous wound care as well as aggressive one on one nutrition encouragement Wet to dry to left groin  Ostomy bag over penrose drains to control fluid Thrombocytopenia off all heparin for now SCD for DVT prophylaxis No anticoagulation for now due to significant bleeding risk  Carley Strickling E 04/28/2014 8:16 AM --  Laboratory Lab Results:  Recent Labs  04/27/14 1700 04/28/14 0400  WBC 8.9 8.6  HGB 11.0* 10.5*  HCT 34.4* 32.8*  PLT 81* PLATELETS APPEAR DECREASED   BMET  Recent Labs  04/27/14 1700 04/28/14 0400  NA 141 136*  K 3.5* 4.2  CL 100 98  CO2 25 21  GLUCOSE 88 79  BUN 15 15  CREATININE 4.23* 4.38*  CALCIUM 8.3* 8.2*    COAG No results found for: INR, PROTIME No results found for: PTT

## 2014-04-28 NOTE — Progress Notes (Signed)
Assessment: 1. Left fem AVG drainage / AVG placed 04/08/14 [place] - s/p removal Dec 9 2. ESRD - HD today 3. HTN/ vol - chronic hypotension on midodrine 5mg daily > ^'d to 5 mg bid here. BP's 80's. No vol excess. On BB for A fib. 4. A. Fib/ CAD . -on plavix, BB. Had heart cath recently in [place], "medical Rx" per pt 5. Right groin PC 6. Right chest AVGG not usable due to occluded SVC and bilat innominates per Dr Darrick PennaFields( Dr. Zena AmosBrian Seller in [place], Vascular surgeon at [phone] mobile, office [phone])  Subjective: Interval History: Stable post op  Objective: Vital signs in last 24 hours: Temp:  [97.1 F (36.2 C)-98.5 F (36.9 C)] 97.5 F (36.4 C) (12/10 0741) Pulse Rate:  [64-124] 90 (12/10 0600) Resp:  [8-27] 14 (12/10 0600) BP: (90-158)/(36-119) 104/54 mmHg (12/10 0600) SpO2:  [74 %-100 %] 98 % (12/10 0600) Weight change:   Intake/Output from previous day: 12/09 0701 - 12/10 0700 In: 2600 [I.V.:1280; Blood:670; IV Piggyback:650] Out: 100 [Blood:100] Intake/Output this shift:    General appearance: alert and cooperative GI: soft, non-tender; bowel sounds normal; no masses,  no organomegaly Extremities: lgroin bandaged and drain present  Lab Results:  Recent Labs  04/27/14 1700 04/28/14 0400  WBC 8.9 8.6  HGB 11.0* 10.5*  HCT 34.4* 32.8*  PLT 81* PLATELETS APPEAR DECREASED   BMET:  Recent Labs  04/27/14 1700 04/28/14 0400  NA 141 136*  K 3.5* 4.2  CL 100 98  CO2 25 21  GLUCOSE 88 79  BUN 15 15  CREATININE 4.23* 4.38*  CALCIUM 8.3* 8.2*   No results for input(s): PTH in the last 72 hours. Iron Studies: No results for input(s): IRON, TIBC, TRANSFERRIN, FERRITIN in the last 72 hours. Studies/Results: Dg Chest Port 1 View  04/27/2014   CLINICAL DATA:  Encounter for central line placement.  EXAM: PORTABLE CHEST - 1 VIEW  COMPARISON:  April 18, 2014.  FINDINGS: Hypoinflation of the lungs is noted. Minimal right basilar subsegmental atelectasis is  noted. Left lung is clear. Left internal jugular catheter is noted with distal tip overlying the expected position of the left innominate vein. Advancement is recommended. No pneumothorax is noted.  IMPRESSION: No pneumothorax status post left internal jugular catheter placement. Distal tip overlies expected position of left innominate vein; advancement is recommended.   Electronically Signed   By: Roque LiasJames  Green M.D.   On: 04/27/2014 14:37   Scheduled: . sodium chloride   Intravenous Once  . aspirin EC  81 mg Oral Q24H  . calcitRIOL  0.5 mcg Oral Q T,Th,Sa-HD  . clonazePAM  1 mg Oral Q24H  . docusate sodium  100 mg Oral BID  . DULoxetine  60 mg Oral Q24H  . feeding supplement (RESOURCE BREEZE)  1 Container Oral BID BM  . ferric gluconate (FERRLECIT/NULECIT) IV  62.5 mg Intravenous Q Thu-HD  . midodrine  5 mg Oral BID  . multivitamin  1 tablet Oral QHS  . pantoprazole  40 mg Oral Q24H  . piperacillin-tazobactam (ZOSYN)  IV  2.25 g Intravenous Q8H  . pravastatin  20 mg Oral Q24H  . sodium chloride  3 mL Intravenous Q12H  . traZODone  150 mg Oral Q24H  . vancomycin  750 mg Intravenous Q T,Th,Sa-HD  . zonisamide  100 mg Oral TID     LOS: 10 days   Keli Buehner C 04/28/2014,8:41 AM

## 2014-04-29 ENCOUNTER — Encounter (HOSPITAL_COMMUNITY): Payer: Self-pay | Admitting: Vascular Surgery

## 2014-04-29 LAB — CBC
HEMATOCRIT: 37.5 % (ref 36.0–46.0)
HEMOGLOBIN: 12 g/dL (ref 12.0–15.0)
MCH: 28.2 pg (ref 26.0–34.0)
MCHC: 32 g/dL (ref 30.0–36.0)
MCV: 88.2 fL (ref 78.0–100.0)
Platelets: 57 10*3/uL — ABNORMAL LOW (ref 150–400)
RBC: 4.25 MIL/uL (ref 3.87–5.11)
RDW: 20.4 % — ABNORMAL HIGH (ref 11.5–15.5)
WBC: 10.9 10*3/uL — AB (ref 4.0–10.5)

## 2014-04-29 NOTE — Progress Notes (Signed)
ANTIBIOTIC CONSULT NOTE   Pharmacy Consult for Vancomycin and Zosyn  Indication: graft infection  Allergies  Allergen Reactions  . Lactose Intolerance (Gi) Diarrhea  . Penicillins Itching   Labs:  Recent Labs  04/27/14 0340 04/27/14 1320 04/27/14 1700 04/28/14 0400  WBC 9.2  --  8.9 8.6  HGB 8.9* 13.6 11.0* 10.5*  PLT 88*  --  81* PLATELETS APPEAR DECREASED  CREATININE 3.85*  --  4.23* 4.38*    Assessment: 78 year old female continues on Vancomycin and Zosyn (started 12/1>)for graft infection S/p removol graft 12/9 Cultures negative to date ESRD -- HD on TThurSat Lower BFR during HD due to low blood pressures.  Tolerating Zosyn despite PCN allergy of itching.  What is LOT for antibiotics?  Goal of Therapy:  Vancomycin pre-HD level 15-25  Plan:  Continue Vancomycin 750 mg iv Q HD  Continue Zosyn 2.25 grams iv Q 8 hours  Thank you. Okey RegalLisa Shirle Provencal, PharmD [phone]   04/29/2014,8:51 AM

## 2014-04-29 NOTE — Progress Notes (Signed)
Assessment: 1. Left fem AVG  - s/p removal Dec 9 2. ESRD - HD Sat. No heparin 3. HTN/ vol - chronic hypotension on midodrine 4. A. Fib/ CAD . -on plavix, BB. Had heart cath recently in [place], "medical Rx" per pt 5. Right groin PC 6. Right chest AVGG not usable due to occluded SVC and bilat innominates per Dr Darrick PennaFields( Dr. Zena AmosBrian Seller in [place], Vascular surgeon at [phone] mobile, office [phone]) 7. Thrombocytopenia, acute and chronic   Subjective: Interval History: DIssapointed about needing to say in hospital Objective: Vital signs in last 24 hours: Temp:  [97.2 F (36.2 C)-99.1 F (37.3 C)] 97.2 F (36.2 C) (12/11 0840) Pulse Rate:  [25-97] 82 (12/11 1000) Resp:  [13-25] 19 (12/11 1000) BP: (88-127)/(37-70) 115/49 mmHg (12/11 1000) SpO2:  [95 %-100 %] 99 % (12/11 1000) Weight:  [84.1 kg (185 lb 6.5 oz)-85.5 kg (188 lb 7.9 oz)] 84.1 kg (185 lb 6.5 oz) (12/10 1740) Weight change:   Intake/Output from previous day: 12/10 0701 - 12/11 0700 In: 765 [I.V.:360; IV Piggyback:405] Out: -1500  Intake/Output this shift: Total I/O In: 110 [I.V.:60; IV Piggyback:50] Out: -   General appearance: alert and cooperative Chest wall: no tenderness GI: soft, non-tender; bowel sounds normal; no masses,  no organomegaly Extremities: no distal LE edema  Lab Results:  Recent Labs  04/28/14 0400 04/29/14 0915  WBC 8.6 10.9*  HGB 10.5* 12.0  HCT 32.8* 37.5  PLT PLATELETS APPEAR DECREASED 57*   BMET:  Recent Labs  04/27/14 1700 04/28/14 0400  NA 141 136*  K 3.5* 4.2  CL 100 98  CO2 25 21  GLUCOSE 88 79  BUN 15 15  CREATININE 4.23* 4.38*  CALCIUM 8.3* 8.2*   No results for input(s): PTH in the last 72 hours. Iron Studies: No results for input(s): IRON, TIBC, TRANSFERRIN, FERRITIN in the last 72 hours. Studies/Results: Dg Chest Port 1 View  04/27/2014   CLINICAL DATA:  Encounter for central line placement.  EXAM: PORTABLE CHEST - 1 VIEW  COMPARISON:  April 18, 2014.  FINDINGS: Hypoinflation of the lungs is noted. Minimal right basilar subsegmental atelectasis is noted. Left lung is clear. Left internal jugular catheter is noted with distal tip overlying the expected position of the left innominate vein. Advancement is recommended. No pneumothorax is noted.  IMPRESSION: No pneumothorax status post left internal jugular catheter placement. Distal tip overlies expected position of left innominate vein; advancement is recommended.   Electronically Signed   By: Roque LiasJames  Green M.D.   On: 04/27/2014 14:37   Scheduled: . sodium chloride   Intravenous Once  . aspirin EC  81 mg Oral Q24H  . calcitRIOL  0.5 mcg Oral Q T,Th,Sa-HD  . clonazePAM  1 mg Oral Q24H  . docusate sodium  100 mg Oral BID  . DULoxetine  60 mg Oral Q24H  . feeding supplement (RESOURCE BREEZE)  1 Container Oral BID BM  . ferric gluconate (FERRLECIT/NULECIT) IV  62.5 mg Intravenous Q Thu-HD  . midodrine  5 mg Oral BID  . multivitamin  1 tablet Oral QHS  . pantoprazole  40 mg Oral Q24H  . piperacillin-tazobactam (ZOSYN)  IV  2.25 g Intravenous Q8H  . pravastatin  20 mg Oral Q24H  . sodium chloride  3 mL Intravenous Q12H  . traZODone  150 mg Oral Q24H  . vancomycin  750 mg Intravenous Q T,Th,Sa-HD  . zonisamide  100 mg Oral TID       LOS: 11  days   Gleen Ripberger C 04/29/2014,11:06 AM

## 2014-04-29 NOTE — Progress Notes (Addendum)
[facility]  Subjective  - wants to go home   Objective 115/49 82 97.2 F (36.2 C) (Oral) 19 99%  Intake/Output Summary (Last 24 hours) at 04/29/14 1018 Last data filed at 04/29/14 1008  Gross per 24 hour  Intake    745 ml  Output  -1500 ml  Net   2245 ml   Left groin wound still with moderate amount of serosanguinous drainage as well as Penrose drains No erythema No significant wound healing so far  Assessment/Planning: Left groin wound no real change keep drains in place until early next week to try to create a tract away from groin Still at high risk for bleeding Central line is intentionally in azygos vein apparently able to infuse but not sample blood. PT SVC and bilateral innominate vein occluded. Will leave in place but minimize blood draws Continue Vanc zosyn for total of 14 days Ok to get out of bed Thrombocytopenia unknown etiology Continue to hold all anticoagulation including pharmacologic DVT meds due to extreme high risk of bleeding Encourage PO intake for severe protein calorie malnutrition  Erika Maddox,Erika Maddox 04/29/2014 10:18 AM --  Laboratory Lab Results:  Recent Labs  04/28/14 0400 04/29/14 0915  WBC 8.6 10.9*  HGB 10.5* 12.0  HCT 32.8* 37.5  PLT PLATELETS APPEAR DECREASED 57*   BMET  Recent Labs  04/27/14 1700 04/28/14 0400  NA 141 136*  K 3.5* 4.2  CL 100 98  CO2 25 21  GLUCOSE 88 79  BUN 15 15  CREATININE 4.23* 4.38*  CALCIUM 8.3* 8.2*    COAG No results found for: INR, PROTIME No results found for: PTT

## 2014-04-30 ENCOUNTER — Inpatient Hospital Stay (HOSPITAL_COMMUNITY): Payer: Medicare HMO

## 2014-04-30 LAB — WOUND CULTURE: Culture: NO GROWTH

## 2014-04-30 LAB — CBC
HEMATOCRIT: 33.1 % — AB (ref 36.0–46.0)
Hemoglobin: 10.5 g/dL — ABNORMAL LOW (ref 12.0–15.0)
MCH: 27.1 pg (ref 26.0–34.0)
MCHC: 31.7 g/dL (ref 30.0–36.0)
MCV: 85.3 fL (ref 78.0–100.0)
Platelets: 71 10*3/uL — ABNORMAL LOW (ref 150–400)
RBC: 3.88 MIL/uL (ref 3.87–5.11)
RDW: 20.3 % — ABNORMAL HIGH (ref 11.5–15.5)
WBC: 10.5 10*3/uL (ref 4.0–10.5)

## 2014-04-30 LAB — RENAL FUNCTION PANEL
Albumin: 2.2 g/dL — ABNORMAL LOW (ref 3.5–5.2)
Anion gap: 16 — ABNORMAL HIGH (ref 5–15)
BUN: 15 mg/dL (ref 6–23)
CO2: 23 meq/L (ref 19–32)
Calcium: 8.3 mg/dL — ABNORMAL LOW (ref 8.4–10.5)
Chloride: 103 mEq/L (ref 96–112)
Creatinine, Ser: 4.48 mg/dL — ABNORMAL HIGH (ref 0.50–1.10)
GFR calc Af Amer: 10 mL/min — ABNORMAL LOW (ref >90)
GFR calc non Af Amer: 9 mL/min — ABNORMAL LOW (ref >90)
Glucose, Bld: 105 mg/dL — ABNORMAL HIGH (ref 70–99)
Phosphorus: 4.2 mg/dL (ref 2.3–4.6)
Potassium: 3 mEq/L — ABNORMAL LOW (ref 3.7–5.3)
Sodium: 142 mEq/L (ref 137–147)

## 2014-04-30 MED ORDER — ACETAMINOPHEN 325 MG PO TABS
ORAL_TABLET | ORAL | Status: AC
  Administered 2014-04-30: 650 mg
  Filled 2014-04-30: qty 2

## 2014-04-30 MED ORDER — SODIUM CHLORIDE 0.9 % IV SOLN: 100.0000 mL | INTRAVENOUS | Status: DC | PRN

## 2014-04-30 MED ORDER — ALTEPLASE 2 MG IJ SOLR
2.0000 mg | Freq: Once | INTRAMUSCULAR | Status: DC | PRN
  Filled 2014-04-30: qty 2

## 2014-04-30 MED ORDER — NEPRO/CARBSTEADY PO LIQD
237.0000 mL | ORAL | Status: DC | PRN
  Filled 2014-04-30: qty 237

## 2014-04-30 MED ORDER — LIDOCAINE-PRILOCAINE 2.5-2.5 % EX CREA: 1.0000 "application " | TOPICAL_CREAM | CUTANEOUS | Status: DC | PRN

## 2014-04-30 MED ORDER — PENTAFLUOROPROP-TETRAFLUOROETH EX AERO: 1.0000 "application " | INHALATION_SPRAY | CUTANEOUS | Status: DC | PRN

## 2014-04-30 MED ORDER — SODIUM CHLORIDE 0.9 % IJ SOLN
10.0000 mL | INTRAMUSCULAR | Status: DC | PRN
  Administered 2014-05-01: 10 mL
  Filled 2014-04-30: qty 40

## 2014-04-30 MED ORDER — SODIUM CHLORIDE 0.9 % IJ SOLN
10.0000 mL | Freq: Two times a day (BID) | INTRAMUSCULAR | Status: DC
  Administered 2014-04-30 – 2014-05-02 (×3): 10 mL
  Administered 2014-05-02: 30 mL
  Administered 2014-05-03 (×2): 10 mL
  Administered 2014-05-05: 30 mL
  Administered 2014-05-05 – 2014-05-06 (×2): 10 mL

## 2014-04-30 MED ORDER — LIDOCAINE HCL (PF) 1 % IJ SOLN: 5.0000 mL | INTRAMUSCULAR | Status: DC | PRN

## 2014-04-30 MED ORDER — HEPARIN SODIUM (PORCINE) 1000 UNIT/ML DIALYSIS: 1000.0000 [IU] | INTRAMUSCULAR | Status: DC | PRN

## 2014-04-30 MED ORDER — MIDODRINE HCL 5 MG PO TABS
ORAL_TABLET | ORAL | Status: AC
  Filled 2014-04-30: qty 1

## 2014-04-30 NOTE — Plan of Care (Signed)
Problem: Phase I Progression Outcomes Goal: Voiding-avoid urinary catheter unless indicated Outcome: Not Applicable Date Met:  37/29/42 Pt ESRD on HD, anuric  Problem: Phase II Progression Outcomes Goal: Dressings dry/intact Outcome: Progressing Daily W>D dressing changes as well as PRN

## 2014-04-30 NOTE — Procedures (Signed)
Low BP with HD. She appears SOB. Got 1000cc pos with treatment.  Will obtain CXR.  May need repeat HD today or [facility]OWELL,Amaziah Raisanen C

## 2014-04-30 NOTE — Progress Notes (Signed)
   VASCULAR SURGERY ASSESSMENT & PLAN:  * 3 Days Post-Op s/p: removal of infected left thigh AV graft  *  The plan is to leave the Penrose drains until drainage from the groin incision stops.  * for hemodialysis today with right femoral cuff dialysis catheter  * Diet being supplemented with Nepro  * Transfer to 3 W.  SUBJECTIVE: No specific complaints.  PHYSICAL EXAM: Filed Vitals:   04/30/14 0100 04/30/14 0200 04/30/14 0300 04/30/14 0400  BP: 103/39 108/50 114/61   Pulse: 75 53 82   Temp:    98.9 F (37.2 C)  TempSrc:    Oral  Resp: 20 19 12    Height:      Weight:   187 lb 13.3 oz (85.2 kg)   SpO2: 95% 98% 97%    Some serous drainage still from left groin incision. Still moderate drainage from Penrose drains. Right femoral catheter site looks fine.  LABS: Lab Results  Component Value Date   WBC 10.9* 04/29/2014   HGB 12.0 04/29/2014   HCT 37.5 04/29/2014   MCV 88.2 04/29/2014   PLT 57* 04/29/2014   Lab Results  Component Value Date   CREATININE 4.38* 04/28/2014   CBG (last 3)   Recent Labs  04/27/14 1920 04/28/14 0736  GLUCAP 89 86    Active Problems:   Arteriovenous graft infection   Cari Carawayhris Dickson Beeper: [phone] 04/30/2014

## 2014-05-01 LAB — RENAL FUNCTION PANEL
Albumin: 2.2 g/dL — ABNORMAL LOW (ref 3.5–5.2)
Anion gap: 14 (ref 5–15)
BUN: 13 mg/dL (ref 6–23)
CALCIUM: 8.3 mg/dL — AB (ref 8.4–10.5)
CHLORIDE: 101 meq/L (ref 96–112)
CO2: 25 meq/L (ref 19–32)
CREATININE: 3.57 mg/dL — AB (ref 0.50–1.10)
GFR calc Af Amer: 13 mL/min — ABNORMAL LOW (ref >90)
GFR calc non Af Amer: 11 mL/min — ABNORMAL LOW (ref >90)
GLUCOSE: 92 mg/dL (ref 70–99)
Phosphorus: 3.3 mg/dL (ref 2.3–4.6)
Potassium: 3 mEq/L — ABNORMAL LOW (ref 3.7–5.3)
Sodium: 140 mEq/L (ref 137–147)

## 2014-05-01 LAB — CLOSTRIDIUM DIFFICILE BY PCR: Toxigenic C. Difficile by PCR: NEGATIVE

## 2014-05-01 MED ORDER — LIDOCAINE HCL (PF) 1 % IJ SOLN: 5.0000 mL | INTRAMUSCULAR | Status: DC | PRN

## 2014-05-01 MED ORDER — PENTAFLUOROPROP-TETRAFLUOROETH EX AERO: 1.0000 "application " | INHALATION_SPRAY | CUTANEOUS | Status: DC | PRN

## 2014-05-01 MED ORDER — ALTEPLASE 2 MG IJ SOLR
2.0000 mg | Freq: Once | INTRAMUSCULAR | Status: AC | PRN
  Filled 2014-05-01: qty 2

## 2014-05-01 MED ORDER — SODIUM CHLORIDE 0.9 % IV SOLN: 100.0000 mL | INTRAVENOUS | Status: DC | PRN

## 2014-05-01 MED ORDER — HEPARIN SODIUM (PORCINE) 1000 UNIT/ML DIALYSIS: 1000.0000 [IU] | INTRAMUSCULAR | Status: DC | PRN

## 2014-05-01 MED ORDER — HEPARIN SODIUM (PORCINE) 1000 UNIT/ML DIALYSIS: 20.0000 [IU]/kg | INTRAMUSCULAR | Status: DC | PRN

## 2014-05-01 MED ORDER — NEPRO/CARBSTEADY PO LIQD
237.0000 mL | ORAL | Status: DC | PRN
  Filled 2014-05-01: qty 237

## 2014-05-01 MED ORDER — VANCOMYCIN HCL IN DEXTROSE 750-5 MG/150ML-% IV SOLN
750.0000 mg | Freq: Once | INTRAVENOUS | Status: AC
  Administered 2014-05-01: 750 mg via INTRAVENOUS
  Filled 2014-05-01: qty 150

## 2014-05-01 MED ORDER — LIDOCAINE-PRILOCAINE 2.5-2.5 % EX CREA
1.0000 "application " | TOPICAL_CREAM | CUTANEOUS | Status: DC | PRN
  Filled 2014-05-01: qty 5

## 2014-05-01 NOTE — Progress Notes (Signed)
   VASCULAR SURGERY ASSESSMENT & PLAN:  * 4 Days Post-Op s/p: removal of infected left thigh AV graft  * ID: She is on IV vancomycin and IV Zosyn. Intraoperative culture is negative 3 days. Repeat C. Difficile is pending.  *  For hemodialysis today  * Awaiting a stepdown bed.  SUBJECTIVE: Specific complaints.  PHYSICAL EXAM: Filed Vitals:   04/30/14 2345 05/01/14 0000 05/01/14 0100 05/01/14 0400  BP:  104/58  109/65  Pulse: 87 84 78 43  Temp:  99 F (37.2 C)  98.8 F (37.1 C)  TempSrc:  Oral    Resp: 31 21 23 22   Height:      Weight:      SpO2: 97% 95% 96% 97%   Still with significant serous drainage from left groin wound. Penrose drains in place.  LABS: Lab Results  Component Value Date   WBC 10.5 04/30/2014   HGB 10.5* 04/30/2014   HCT 33.1* 04/30/2014   MCV 85.3 04/30/2014   PLT 71* 04/30/2014   Lab Results  Component Value Date   CREATININE 4.48* 04/30/2014   Active Problems:   Arteriovenous graft infection   Cari CarawayChris Hajra Port Beeper: [phone] 05/01/2014

## 2014-05-01 NOTE — Progress Notes (Signed)
ANTIBIOTIC CONSULT NOTE   Pharmacy Consult for Vancomycin and Zosyn  Indication: graft infection  Allergies  Allergen Reactions  . Lactose Intolerance (Gi) Diarrhea  . Penicillins Itching   Labs:  Recent Labs  04/29/14 0915 04/30/14 0714 05/01/14 0800  WBC 10.9* 10.5  --   HGB 12.0 10.5*  --   PLT 57* 71*  --   CREATININE  --  4.48* 3.57*    Assessment: 78 year old female continues on Vancomycin and Zosyn (started 12/1>)for graft infection S/p removol graft 12/9 Cultures negative to date ESRD -- HD on TThurSat  Extra HD session 12/13  Tolerating Zosyn despite PCN allergy of itching.  What is LOT for antibiotics?  Can antibiotics be de-escalated?    Goal of Therapy:  Vancomycin pre-HD level 15-25  Plan:  Continue Vancomycin 750 mg iv Q HD  (extra dose after HD today) Continue Zosyn 2.25 grams iv Q 8 hours  Thank you. Okey RegalLisa Raidyn Breiner, PharmD [phone]   05/01/2014,11:46 AM

## 2014-05-01 NOTE — Procedures (Signed)
Tolerating hemodialysis.  CXR yesterday no CHF.  Weight is up significantly but exam not suggestive of overload. BP remains low but will accept a systolic of 80mm Hg. Shoaib Siefker C

## 2014-05-02 DIAGNOSIS — N186 End stage renal disease: Secondary | ICD-10-CM

## 2014-05-02 DIAGNOSIS — Z992 Dependence on renal dialysis: Secondary | ICD-10-CM

## 2014-05-02 MED ORDER — LOPERAMIDE HCL 2 MG PO CAPS
2.0000 mg | ORAL_CAPSULE | ORAL | Status: DC | PRN
  Administered 2014-05-02: 2 mg via ORAL
  Filled 2014-05-02 (×2): qty 1

## 2014-05-02 MED ORDER — POTASSIUM CHLORIDE CRYS ER 20 MEQ PO TBCR
30.0000 meq | EXTENDED_RELEASE_TABLET | Freq: Three times a day (TID) | ORAL | Status: AC
  Administered 2014-05-02 (×2): 30 meq via ORAL
  Filled 2014-05-02 (×4): qty 1

## 2014-05-02 NOTE — Progress Notes (Signed)
[facility] Progress Note   Subjective: "tired" , otherwise no c/o  Filed Vitals:   05/02/14 0740 05/02/14 0800 05/02/14 0900 05/02/14 1000  BP: 103/47 103/48    Pulse: 82 78 74 107  Temp: 97.9 F (36.6 C)     TempSrc: Oral     Resp: 19 20 17 21   Height:      Weight:      SpO2: 99% 99% 99% 97%   Exam: Alert, no distress No jvd Chest bilat coarse insp rales at bases RRR no MRG Abd obese, soft, NTND, no ascites +pitting UE edema bilat Mild LE edema bilat R groin TDC clean exit site L groin ostomy bag over wound site w drains in place Neuro gen weakness, alert, Ox 3, nf  HD: [facility] (trans[place] from [place] here for 3 month stay) 4h   78kg  2/2.0 BAth   R femoral Permcath / L fem AVGG (now removed)  Heparin 4000 Calcitriol 0.5 ug tiw, Aranesp 100 ug/ wk,  Venofer 50 mg / week BP runs low entire HD , "70's - 80's" is typical, starts to feel bad when BP gets "in the 50's"    Assessment: 1. S/P removal L thigh AVG 12/9 - no +cultures from wounds or blood, on empiric abx  2. ESRD on HD 3. Vol excess / SOB - 5kg up, last cxr clear on 12/12 4. Chronic hypotension on midodrine - ^'d to 5 bid here 5. Access - not doing well from access standpoint 6. Thrombocytopenia - acute on chronic 7. AFib 8. Hx CAD / MI - has had 3 heart caths , no stents per pt 9. Malnutrition - she is eating more 10. Hypokalemia - replace 11. EOL - says she still wants to be full code  Plan- HD tomorrow, lower BP cut-off to 70's, get volume down some if possible    Erika Moselleob Flecia Shutter MD  pager [phone]    cell [phone]  05/02/2014, 10:33 AM     Recent Labs Lab 04/26/14 1049  04/28/14 0400 04/30/14 0714 05/01/14 0800  NA 136*  < > 136* 142 140  K 3.8  < > 4.2 3.0* 3.0*  CL 96  < > 98 103 101  CO2 22  < > 21 23 25   GLUCOSE 108*  < > 79 105* 92  BUN 28*  < > 15 15 13   CREATININE 6.27*  < > 4.38* 4.48* 3.57*  CALCIUM 8.5  < > 8.2* 8.3* 8.3*  PHOS 5.1*  --   --  4.2 3.3  < > =  values in this interval not displayed.  Recent Labs Lab 04/26/14 1049 04/30/14 0714 05/01/14 0800  ALBUMIN 2.4* 2.2* 2.2*    Recent Labs Lab 04/28/14 0400 04/29/14 0915 04/30/14 0714  WBC 8.6 10.9* 10.5  HGB 10.5* 12.0 10.5*  HCT 32.8* 37.5 33.1*  MCV 84.8 88.2 85.3  PLT PLATELETS APPEAR DECREASED 57* 71*   . aspirin EC  81 mg Oral Q24H  . calcitRIOL  0.5 mcg Oral Q T,Th,Sa-HD  . clonazePAM  1 mg Oral Q24H  . docusate sodium  100 mg Oral BID  . DULoxetine  60 mg Oral Q24H  . feeding supplement (RESOURCE BREEZE)  1 Container Oral BID BM  . ferric gluconate (FERRLECIT/NULECIT) IV  62.5 mg Intravenous Q Thu-HD  . midodrine  5 mg Oral BID  . multivitamin  1 tablet Oral QHS  . pantoprazole  40 mg Oral Q24H  . piperacillin-tazobactam (ZOSYN)  IV  2.25 g Intravenous Q8H  . pravastatin  20 mg Oral Q24H  . sodium chloride  10-40 mL Intracatheter Q12H  . sodium chloride  3 mL Intravenous Q12H  . traZODone  150 mg Oral Q24H  . vancomycin  750 mg Intravenous Q T,Th,Sa-HD  . zonisamide  100 mg Oral TID   . sodium chloride Stopped (05/01/14 0300)   sodium chloride, sodium chloride, sodium chloride, acetaminophen **OR** acetaminophen, ALPRAZolam, bisacodyl, bisacodyl, feeding supplement (NEPRO CARB STEADY), guaiFENesin-dextromethorphan, heparin, heparin, hydrALAZINE, labetalol, lidocaine (PF), lidocaine-prilocaine, metoprolol, morphine injection, ondansetron, oxyCODONE-acetaminophen, pentafluoroprop-tetrafluoroeth, phenol, potassium chloride, sodium chloride, sodium chloride

## 2014-05-02 NOTE — Progress Notes (Signed)
   VASCULAR SURGERY ASSESSMENT & PLAN:  * 5 Days Post-Op s/p: Removal of infected left thigh AV graft. She is still having significant lymphatic drainage from the left groin. I think the best option is to place an incisional VAC to try to close down the lymphatic leakage. Once this is in place I would favor covering the Penrose drains with gauze rather than continuing with the ostomy bag.  *  She is still waiting for a stepdown bed.  * ID: she is on IV vancomycin and IV Zosyn. Her intraoperative wound culture has been negative. Her most recent C. Difficile has been negative.  SUBJECTIVE: No specific complaints.  PHYSICAL EXAM: Filed Vitals:   05/02/14 0000 05/02/14 0340 05/02/14 0400 05/02/14 0740  BP: 97/55  93/39 103/47  Pulse: 94  73 82  Temp: 98.5 F (36.9 C) 98.2 F (36.8 C)  97.9 F (36.6 C)  TempSrc: Oral Oral  Oral  Resp:   19 19  Height:      Weight:      SpO2: 99%  98% 99%   Still with significant lymphatic drainage from left groin. Not much drainage from the Penrose drain site. Catheter in place in right groin.  LABS: Lab Results  Component Value Date   WBC 10.5 04/30/2014   HGB 10.5* 04/30/2014   HCT 33.1* 04/30/2014   MCV 85.3 04/30/2014   PLT 71* 04/30/2014   Lab Results  Component Value Date   CREATININE 3.57* 05/01/2014   Active Problems:   Arteriovenous graft infection   Erika CarawayChris Dickson Beeper: [phone] 05/02/2014

## 2014-05-02 NOTE — Consult Note (Addendum)
WOC wound consult note Reason for Consult: Requested to apply Vac dressing to incision on left groin by VVS team. They plan to follow for assessment and plan of care. Wound type: Left groin with closed incision and staple line; full thickness opening is .1X.1cm between incision. Unable to determine depth since it is a pinhole-opening. Drainage (amount, consistency, odor) Large amt yellow drainage which reappears as soon as it is swabbed, no odor. Periwound: White macerated wound edges from prolonged moisture. Dressing procedure/placement/frequency: Applied one piece of Mepitel contact layer to protect skin and one piece black foam applied to 125mm cont suction. Barrier ring applied to maintain seal with prolonged moisture.  Pt tolerated without c/o pain. According to [facility] literature, it is best practice to leave incisional Vac dressings in place for up to 7 days without a dressing change required.  Please re-consult if further assistance is needed.  Thank-you,  Cammie Mcgeeawn Mahkai Fangman MSN, RN, CWOCN, [place], CNS [phone]

## 2014-05-03 LAB — RENAL FUNCTION PANEL
ANION GAP: 11 (ref 5–15)
Albumin: 2.1 g/dL — ABNORMAL LOW (ref 3.5–5.2)
BUN: 23 mg/dL (ref 6–23)
CHLORIDE: 108 meq/L (ref 96–112)
CO2: 25 mEq/L (ref 19–32)
Calcium: 8.6 mg/dL (ref 8.4–10.5)
Creatinine, Ser: 4.16 mg/dL — ABNORMAL HIGH (ref 0.50–1.10)
GFR calc non Af Amer: 10 mL/min — ABNORMAL LOW (ref >90)
GFR, EST AFRICAN AMERICAN: 11 mL/min — AB (ref >90)
GLUCOSE: 92 mg/dL (ref 70–99)
Phosphorus: 3.5 mg/dL (ref 2.3–4.6)
Potassium: 4.4 mEq/L (ref 3.7–5.3)
SODIUM: 144 meq/L (ref 137–147)

## 2014-05-03 LAB — CBC
HCT: 29.6 % — ABNORMAL LOW (ref 36.0–46.0)
HEMOGLOBIN: 9.4 g/dL — AB (ref 12.0–15.0)
MCH: 27.4 pg (ref 26.0–34.0)
MCHC: 31.8 g/dL (ref 30.0–36.0)
MCV: 86.3 fL (ref 78.0–100.0)
Platelets: 61 10*3/uL — ABNORMAL LOW (ref 150–400)
RBC: 3.43 MIL/uL — ABNORMAL LOW (ref 3.87–5.11)
RDW: 21.3 % — AB (ref 11.5–15.5)
WBC: 10.4 10*3/uL (ref 4.0–10.5)

## 2014-05-03 LAB — MRSA PCR SCREENING: MRSA BY PCR: NEGATIVE

## 2014-05-03 MED ORDER — SODIUM CHLORIDE 0.9 % IV SOLN: 100.0000 mL | INTRAVENOUS | Status: DC | PRN

## 2014-05-03 MED ORDER — NEPRO/CARBSTEADY PO LIQD: 237.0000 mL | ORAL | Status: DC | PRN

## 2014-05-03 MED ORDER — LIDOCAINE HCL (PF) 1 % IJ SOLN: 5.0000 mL | INTRAMUSCULAR | Status: DC | PRN

## 2014-05-03 MED ORDER — DARBEPOETIN ALFA 25 MCG/0.42ML IJ SOSY
PREFILLED_SYRINGE | INTRAMUSCULAR | Status: AC
  Filled 2014-05-03: qty 0.42

## 2014-05-03 MED ORDER — PENTAFLUOROPROP-TETRAFLUOROETH EX AERO: 1.0000 "application " | INHALATION_SPRAY | CUTANEOUS | Status: DC | PRN

## 2014-05-03 MED ORDER — DARBEPOETIN ALFA 25 MCG/0.42ML IJ SOSY
25.0000 ug | PREFILLED_SYRINGE | INTRAMUSCULAR | Status: DC
  Administered 2014-05-03: 25 ug via INTRAVENOUS

## 2014-05-03 MED ORDER — ALTEPLASE 2 MG IJ SOLR
2.0000 mg | Freq: Once | INTRAMUSCULAR | Status: DC | PRN
  Filled 2014-05-03: qty 2

## 2014-05-03 MED ORDER — HEPARIN SODIUM (PORCINE) 1000 UNIT/ML DIALYSIS: 1000.0000 [IU] | INTRAMUSCULAR | Status: DC | PRN

## 2014-05-03 MED ORDER — MIDODRINE HCL 5 MG PO TABS
ORAL_TABLET | ORAL | Status: DC
Start: 2014-05-03 — End: 2014-05-03
  Filled 2014-05-03: qty 2

## 2014-05-03 MED ORDER — HEPARIN SODIUM (PORCINE) 1000 UNIT/ML DIALYSIS: 2000.0000 [IU] | Freq: Once | INTRAMUSCULAR | Status: DC

## 2014-05-03 MED ORDER — OXYCODONE-ACETAMINOPHEN 5-325 MG PO TABS
ORAL_TABLET | ORAL | Status: AC
Start: 2014-05-03 — End: 2014-05-03
  Administered 2014-05-03: 1 via ORAL
  Filled 2014-05-03: qty 1

## 2014-05-03 MED ORDER — LIDOCAINE-PRILOCAINE 2.5-2.5 % EX CREA: 1.0000 "application " | TOPICAL_CREAM | CUTANEOUS | Status: DC | PRN

## 2014-05-03 NOTE — Procedures (Signed)
I was present at this dialysis session, have reviewed the session itself and made  appropriate changes  Rob Emanuela Runnion MD (pgr) [phone]    (c) [phone] 05/03/2014, 11:16 AM   

## 2014-05-03 NOTE — Progress Notes (Signed)
Subjective:  Seen on dialysis, no complaints, but concerned about tolerating large UF goal  Objective: Vital signs in last 24 hours: Temp:  [97.8 F (36.6 C)-98.3 F (36.8 C)] 97.8 F (36.6 C) (12/15 0402) Pulse Rate:  [37-107] 85 (12/15 0830) Resp:  [15-26] 17 (12/15 0830) BP: (67-153)/(33-74) 94/49 mmHg (12/15 0830) SpO2:  [96 %-100 %] 97 % (12/15 0436) Weight:  [84.7 kg (186 lb 11.7 oz)] 84.7 kg (186 lb 11.7 oz) (12/15 0635) Weight change: 0.2 kg (7.1 oz)  Intake/Output from previous day: 12/14 0701 - 12/15 0700 In: 780 [P.O.:600; I.V.:30; IV Piggyback:150] Out: 75 [Drains:75] Intake/Output this shift:  Lab Results:  Recent Labs  05/03/14 0649  WBC 10.4  HGB 9.4*  HCT 29.6*  PLT 61*   BMET:  Recent Labs  05/01/14 0800 05/03/14 0649  NA 140 144  K 3.0* 4.4  CL 101 108  CO2 25 25  GLUCOSE 92 92  BUN 13 23  CREATININE 3.57* 4.16*  CALCIUM 8.3* 8.6  ALBUMIN 2.2* 2.1*   No results for input(s): PTH in the last 72 hours. Iron Studies: No results for input(s): IRON, TIBC, TRANSFERRIN, FERRITIN in the last 72 hours.  Studies/Results: No results found.   EXAM: General appearance:  Alert, in no apparent distress Resp:  Coarse bibasilar rales Cardio:  RRR without murmur or rub GI:  + BS, soft and nontender Extremities:  No edema Access:   R femoral catheter  [facility]: [facility] [place] for 3 month stay) 4h 78kg 2/2.0 BAth R femoral Permcath / L fem AVGG (now removed) Heparin 4000 Calcitriol 0.5 ug tiw, Aranesp 100 ug/ wk, Venofer 50 mg / week BP runs low entire HD , "70's - 80's" is typical, starts to feel bad when BP gets "in the 50's"  Assessment/Plan: 1. Infected L thigh AVG - removed 12/9 per Dr. Darrick PennaFields, no + cultures wound or blood cultures, on empiric abx 2. ESRD - HD on TTS schedule. 3. Vol excess / SOB - low BPs, but 6 L up pre-HD, last cxr clear on 12/12. 4. Chronic hypotension - on Midodrine, up to 5 mg bid now. 5. Anemia - Hgb  9.4, weekly Fe.  Start Aranesp. 6. Thrombocytopenia - acute on chronic 7. AFib 8. Hx CAD / MI - has had 3 heart caths , no stents per pt 9. Malnutrition - Alb 2.1, improving appetite 10. Hypokalemia - up to 4.4 11. EOL - says she still wants to be full code    LOS: 15 days   LYLES,CHARLES 05/03/2014,8:56 AM  Pt seen, examined and agree w A/P as above.  Vinson Moselleob Hicks Feick MD pager [phone]    cell [phone] 05/03/2014, 11:18 AM

## 2014-05-03 NOTE — Progress Notes (Addendum)
Pt transferred to 2W33 with belongings and husband. Pt with no complaints of pain, VS WNL, GCS 15 and safety maintained. Bedside report given and receiving RN at bedside.

## 2014-05-03 NOTE — Progress Notes (Signed)
[facility]  Subjective  - eating better   Objective 99/46 92 97.6 F (36.4 C) (Oral) 18 95%  Intake/Output Summary (Last 24 hours) at 05/03/14 1518 Last data filed at 05/03/14 1146  Gross per 24 hour  Intake    420 ml  Output   3575 ml  Net  -3155 ml   Left groin VAC in place Rusty colored drainage from penrose No erythema  Assessment/Planning: Slowly healing groin wound with persitent drainage Will take down VAC on Friday to recheck wound Leave penrose drains for now and start to pull back starting tomorrow Transfer 2W  Sherren KernsFIELDS,Margrit Minner E 05/03/2014 3:18 PM --  Laboratory Lab Results:  Recent Labs  05/03/14 0649  WBC 10.4  HGB 9.4*  HCT 29.6*  PLT 61*   BMET  Recent Labs  05/01/14 0800 05/03/14 0649  NA 140 144  K 3.0* 4.4  CL 101 108  CO2 25 25  GLUCOSE 92 92  BUN 13 23  CREATININE 3.57* 4.16*  CALCIUM 8.3* 8.6    COAG No results found for: INR, PROTIME No results found for: PTT

## 2014-05-04 NOTE — Progress Notes (Signed)
[facility] [place]  Subjective  - sleepy   Objective 90/44 78 98.2 F (36.8 C) (Oral) 18 100%  Intake/Output Summary (Last 24 hours) at 05/04/14 0817 Last data filed at 05/04/14 0700  Gross per 24 hour  Intake    210 ml  Output   3575 ml  Net  -3365 ml   Left groin VAC in place still with brownish thin drainage Penrose drains serous fluid  Assessment/Planning: S/p thigh graft removal poorly healing left groin with lymphatic leak Continue Vanc Will start to withdraw penrose drains a little daily Change VAC Friday morning Continue to push nutrition  Jacory Kamel E 05/04/2014 8:17 AM --  Laboratory Lab Results:  Recent Labs  05/03/14 0649  WBC 10.4  HGB 9.4*  HCT 29.6*  PLT 61*   BMET  Recent Labs  05/03/14 0649  NA 144  K 4.4  CL 108  CO2 25  GLUCOSE 92  BUN 23  CREATININE 4.16*  CALCIUM 8.6    COAG No results found for: INR, PROTIME No results found for: PTT

## 2014-05-04 NOTE — Progress Notes (Signed)
NUTRITION FOLLOW UP  DOCUMENTATION CODES Per approved criteria  -Obesity Unspecified   Intervention: Continue Resource Breeze po BID, each supplement provides 250 kcal and 9 grams of protein Continue RENA-VIT daily RD to follow for nutrition care plan  New Nutrition Dx: Increased nutrient needs related to ESRD on HD as evidenced by estimated nutrition needs, ongoing  Goal: Pt to meet >/= 90% of their estimated nutrition needs, progressing  Monitor:  PO & supplemental intake, weight, labs, I/O's  ASSESSMENT: 78 y.o. year old Female with PMH of ESRD on HD, atrial fibrillation, fibromyalgia and HTN; presented for evaluation of left groin drainage.  Patient s/p procedures 12/9: INSERTION OF LEFT INTERNAL JUGULAR VEIN INTRAVENOUS CATHETER RIGHT CHEST WALL SHUNTOGRAM REMOVAL LEFT THIGH GRAFT  Patient continues on a Renal 1200 ml fluid restriced diet.  PO intake decreased at 25-50% per flowsheet records.  Receiving Resource Breeze supplements.    Vascular Surgery notes reviewed.  For take down of L groin VAC on Friday to recheck wound.  Height: Ht Readings from Last 1 Encounters:  04/18/14 5\' 2"  (1.575 m)    Weight: Wt Readings from Last 1 Encounters:  05/04/14 164 lb 3.9 oz (74.5 kg)    BMI:  Body mass index is 30.03 kg/(m^2).  Estimated Nutritional Needs: Kcal: 1900-2100 Protein: 90-100 gm Fluid: 1200 ml  Skin: left groin wound VAC  Diet Order: Diet renal W/124700mL fluid restriction   Intake/Output Summary (Last 24 hours) at 05/04/14 1217 Last data filed at 05/04/14 0900  Gross per 24 hour  Intake    280 ml  Output     75 ml  Net    205 ml    Labs:   Recent Labs Lab 04/30/14 0714 05/01/14 0800 05/03/14 0649  NA 142 140 144  K 3.0* 3.0* 4.4  CL 103 101 108  CO2 23 25 25   BUN 15 13 23   CREATININE 4.48* 3.57* 4.16*  CALCIUM 8.3* 8.3* 8.6  PHOS 4.2 3.3 3.5  GLUCOSE 105* 92 92    Scheduled Meds: . aspirin EC  81 mg Oral Q24H  . calcitRIOL  0.5  mcg Oral Q T,Th,Sa-HD  . clonazePAM  1 mg Oral Q24H  . darbepoetin (ARANESP) injection - DIALYSIS  25 mcg Intravenous Q Tue-HD  . DULoxetine  60 mg Oral Q24H  . feeding supplement (RESOURCE BREEZE)  1 Container Oral BID BM  . ferric gluconate (FERRLECIT/NULECIT) IV  62.5 mg Intravenous Q Thu-HD  . midodrine  5 mg Oral BID  . multivitamin  1 tablet Oral QHS  . pantoprazole  40 mg Oral Q24H  . pravastatin  20 mg Oral Q24H  . sodium chloride  10-40 mL Intracatheter Q12H  . traZODone  150 mg Oral Q24H  . vancomycin  750 mg Intravenous Q T,Th,Sa-HD  . zonisamide  100 mg Oral TID    Continuous Infusions: . sodium chloride Stopped (05/01/14 0300)    Past Medical History  Diagnosis Date  . Renal disorder   . Dialysis patient   . Atrial fibrillation   . Fibromyalgia   . Hypertension     Past Surgical History  Procedure Laterality Date  . Cardiac surgery    . Cholecystectomy    . Abdominal hysterectomy    . Cardiac catheterization Left 04/27/2014    Procedure: CENTRAL LINE INSERTION;  Surgeon: Sherren Kernsharles E Fields, MD;  [facility];  Service: Vascular;  Laterality: Left;  . Fistulogram Right 04/27/2014    Procedure: SHUNTOGRAM OF RIGHT CHEST ARTERIOVENOUS  GRAFT;  Surgeon: Sherren Kernsharles E Fields, MD;  [facility];  Service: Vascular;  Laterality: Right;  . Avgg removal Left 04/27/2014    Procedure: REMOVAL OF ARTERIOVENOUS GORETEX GRAFT (AVGG);  Surgeon: Sherren Kernsharles E Fields, MD;  [facility];  Service: Vascular;  Laterality: Left;    Maureen ChattersKatie Tigerlily Christine, RD, LDN Pager #: [phone] After-Hours Pager #: [phone]

## 2014-05-04 NOTE — Progress Notes (Signed)
ANTIBIOTIC CONSULT NOTE   Pharmacy Consult for Vancomycin Indication: graft infection  Allergies  Allergen Reactions  . Lactose Intolerance (Gi) Diarrhea  . Penicillins Itching   Labs:  Recent Labs  05/03/14 0649  WBC 10.4  HGB 9.4*  PLT 61*  CREATININE 4.16*    Assessment: 78 year old female continues on Vancomycin and Zosyn (started 12/1>)for graft infection.  S/p removol graft 12/9.  Drain and Wound VAC still in place.  Cultures negative to date ESRD -- HD on TThurSat  Goal of Therapy:  Vancomycin pre-HD level 15-25  Plan:  Continue Vancomycin 750 mg iv Q HD Vancomycin pre-HD level ordered for 12/17.  Toys 'R' UsKimberly Fredia Chittenden, Pharm.D., BCPS Clinical Pharmacist Pager [phone] 05/04/2014 2:01 PM

## 2014-05-04 NOTE — Progress Notes (Signed)
Dsg changed to left thigh drain. Patient tolerated well. Patient also likes to keep a towel over top of the dressing. RN will continue to monitor. Bradley FerrisBrandie Dale Strausser RN BSN 05/04/2014 5:25 PM

## 2014-05-04 NOTE — Progress Notes (Signed)
Subjective:  Appetite "much better", no current complaitns. Tx'd out of SDU yesterday to floor bed  Objective: Vital signs in last 24 hours: Temp:  [97.6 F (36.4 C)-98.2 F (36.8 C)] 97.6 F (36.4 C) (12/16 1404) Pulse Rate:  [78-89] 78 (12/16 0343) Resp:  [18-20] 18 (12/16 0343) BP: (90-133)/(44-54) 103/48 mmHg (12/16 1404) SpO2:  [100 %] 100 % (12/16 1404) Weight:  [74.5 kg (164 lb 3.9 oz)] 74.5 kg (164 lb 3.9 oz) (12/16 0343) Weight change: -3.8 kg (-8 lb 6 oz)  Intake/Output from previous day: 12/15 0701 - 12/16 0700 In: 210 [I.V.:10; IV Piggyback:200] Out: 3575 [Drains:75] Intake/Output this shift:  Lab Results:  Recent Labs  05/03/14 0649  WBC 10.4  HGB 9.4*  HCT 29.6*  PLT 61*   BMET:   Recent Labs  05/03/14 0649  NA 144  K 4.4  CL 108  CO2 25  GLUCOSE 92  BUN 23  CREATININE 4.16*  CALCIUM 8.6  ALBUMIN 2.1*   No results for input(s): PTH in the last 72 hours. Iron Studies: No results for input(s): IRON, TIBC, TRANSFERRIN, FERRITIN in the last 72 hours.  Studies/Results: No results found.   EXAM: General appearance:  Alert, in no apparent distress Resp:  Coarse bibasilar rales Cardio:  RRR without murmur or rub GI:  + BS, soft and nontender Extremities:  No edema Access:   R femoral catheter  [place]: [facility] [place] for 3 month stay) 4h 78kg 2/2.0 BAth R femoral Permcath (L thigh AVGG now removed) Heparin 4000 Calcitriol 0.5 ug tiw, Aranesp 100 ug/ wk, Venofer 50 mg / week BP runs low entire HD , "70's - 80's" is typical, starts to feel bad when BP gets "in the 50's"  Assessment/Plan: 1. Infected L thigh AVG - removed 12/9 per Dr. Darrick PennaFields, no + cultures wound or blood cultures, on empiric abx 2. ESRD - HD on TTS schedule. 3. Volume - 4 kg under dry wt now, euvolemic on exam 4. Chronic hypotension - on Midodrine, ^'d to 5 mg bid 5. Anemia - Hgb 9.4, weekly Fe.  Started Aranesp. 6. Thrombocytopenia - acute on  chronic 7. AFib 8. Hx CAD / MI - has had 3 heart caths , no stents per pt 9. Malnutrition - Alb 2.1, improving appetite 10. Hypokalemia - up to 4.4 11. Access - She has a R upper chest AVG with a bruit; shuntogram done by Dr Darrick PennaFields on 12/9 w contrast injections showed no filling of SVC or innominate veins, there was "some flow in distal subclavian vein and a into a large collateral over the shoulder". We may consider trying this access since no other good options now and get pre/post BUN.     Erika Moselleob Erika Schwenn MD pager [phone]    cell [phone] 05/04/2014, 4:26 PM

## 2014-05-04 NOTE — Care Management Note (Signed)
Page 1 of 2   05/06/2014     4:20:42 PM CARE MANAGEMENT NOTE 05/06/2014  Patient:  Erika Maddox,Erika Maddox   Account Number:  [id number]  Date Initiated:  04/22/2014  Documentation initiated by:  Donn PieriniWEBSTER,KRISTI  Subjective/Objective Assessment:   Pt admitted with infected groin graft     Action/Plan:   PTA pt lived at home in [place] here visiting   Anticipated DC Date:  05/06/2014   Anticipated DC Plan:  HOME W HOME HEALTH SERVICES      DC Planning Services  CM consult      [facility]  HOME HEALTH  DURABLE MEDICAL EQUIPMENT   Choice offered to / List presented to:  C-1 Patient   DME arranged  VAC      DME agency  [facility]     HH arranged  HH-1 RN      [facility]   Status of service:  Completed, signed off Medicare Important Message given?  YES (If response is "NO", the following Medicare IM given date fields will be blank) Date Medicare IM given:  05/03/2014 Medicare IM given by:  Aybree Lanyon Date Additional Medicare IM given:  04/28/2014 Additional Medicare IM given by:  Verdis PrimeHENRIETTA Muneeb Veras  [facility]:  [facility]  Per UR Regulation:  Reviewed for med. necessity/level of care/duration of stay  If discussed at Long Length of Stay Meetings, dates discussed:   04/26/2014  04/28/2014  05/05/2014    Comments:  Contact: Maddox,Donald Spouse [phone]  05/06/14 1549 Arianna Haydon RN MSN BSN CCM Pt to d/c with wound VAC, home health nursing for [facility], and continuation of home O2.  Wound VAC documentation completed.  Per [facility], VAC has been ordered and will be delivered to pt.  Spoke with [employer] and O2 will be delivered to son's home.  Provided information re home health agencies to pt and spouse who spoke with dtr-in-law and then chose [facility] - referral made.  Pt will d/c to son and dtr-in-law's home: [address], [place], [address].  Son and dtr-in-law, Kipp BroodBrent and Camelia EngLindy Howell [phone] will be  assisting pt as needed.  05/05/14- 1450- Donn PieriniKristi Webster RN, BSN [phone] received f/u call from Tuesday with pt's insurance carrier- regarding motorized w/c- per TC- there is no in-network DME provider in [place] that they have found that can provide a motorized w/c- the only DME provider that they have been able to find is an out of network provider- [facility] that could work with pt on a rental basis when she returns home for $200/mo (50% out of pocket cost) for the motorized w/c- (fax # for [facility] is [phone]- contact person [place])-  05/04/14- 1130- Donn PieriniKristi Webster RN, BSN [phone] Received call from Tuesday a CM with pt's insurance regarding a motorized w/c- per conversation pt evidently has a script for a motorized w/c but has had difficulty getting one- contact # for Tuesday is [phone]- went to see pt to discuss w/c needs and d/c planning- per conversation with pt she states that yes she does have a script for a motorized w/c but that it is old and she had tried to get one back home but could not because the script did not have all the necessary documentation for insurance to approve the w/c. Pt reports that this trip was planned in advance and that she has good family support- as she is able to progress with activity will see if pt will  need rehab vs being able to return [facility]- pt will ultimately return home to [place]- she still would like a motorized w/c - but unsure how she would get it back home with her to [place]- may need to rent one while she is here if needed- and then get a new script from her PCP once she returns home- this was all discussed with pt who agrees to think about which way might be best- NCM to continue to follow for d/c needs and planning.      05-02-14 2:45pm Avie ArenasSarah Brown, RNBSN [phone] Continued drainage from where infected graft removed. Wound Vac applied.  04/28/14 1144 Oni Dietzman RN MSN BSN CCM S/P insertion of left  internal jugular vein intravenous catheter/ right chest wall shuntogram/removal left thigh graft with bovine patch angioplasty left superficial femoral artery.   04/27/14- 1600- Donn PieriniKristi Webster RN, BSN [phone] Pt for OR today for graft removal.  04/25/14- 1400- Donn PieriniKristi Webster RN, BSN [phone] Pt with infected femoral graft - plan is for OR on wed. 04/27/14- for graft removal- wound VAC d/c'd today

## 2014-05-05 LAB — RENAL FUNCTION PANEL
Albumin: 2.1 g/dL — ABNORMAL LOW (ref 3.5–5.2)
Anion gap: 13 (ref 5–15)
BUN: 27 mg/dL — ABNORMAL HIGH (ref 6–23)
CALCIUM: 8.5 mg/dL (ref 8.4–10.5)
CHLORIDE: 101 meq/L (ref 96–112)
CO2: 25 mEq/L (ref 19–32)
CREATININE: 4.64 mg/dL — AB (ref 0.50–1.10)
GFR calc Af Amer: 10 mL/min — ABNORMAL LOW (ref >90)
GFR, EST NON AFRICAN AMERICAN: 8 mL/min — AB (ref >90)
Glucose, Bld: 99 mg/dL (ref 70–99)
PHOSPHORUS: 4.1 mg/dL (ref 2.3–4.6)
Potassium: 3.8 mEq/L (ref 3.7–5.3)
Sodium: 139 mEq/L (ref 137–147)

## 2014-05-05 LAB — VANCOMYCIN, RANDOM: Vancomycin Rm: 22.5 ug/mL

## 2014-05-05 LAB — BUN
BUN: 27 mg/dL — AB (ref 6–23)
BUN: 8 mg/dL (ref 6–23)

## 2014-05-05 MED ORDER — SODIUM CHLORIDE 0.9 % IV SOLN: 100.0000 mL | INTRAVENOUS | Status: DC | PRN

## 2014-05-05 MED ORDER — PENTAFLUOROPROP-TETRAFLUOROETH EX AERO: 1.0000 "application " | INHALATION_SPRAY | CUTANEOUS | Status: DC | PRN

## 2014-05-05 MED ORDER — LIDOCAINE HCL (PF) 1 % IJ SOLN: 5.0000 mL | INTRAMUSCULAR | Status: DC | PRN

## 2014-05-05 MED ORDER — ALTEPLASE 2 MG IJ SOLR
2.0000 mg | Freq: Once | INTRAMUSCULAR | Status: DC | PRN
  Filled 2014-05-05: qty 2

## 2014-05-05 MED ORDER — HEPARIN SODIUM (PORCINE) 1000 UNIT/ML DIALYSIS
4000.0000 [IU] | Freq: Once | INTRAMUSCULAR | Status: DC
  Filled 2014-05-05: qty 4

## 2014-05-05 MED ORDER — NEPRO/CARBSTEADY PO LIQD
237.0000 mL | ORAL | Status: DC | PRN
  Filled 2014-05-05: qty 237

## 2014-05-05 MED ORDER — HEPARIN SODIUM (PORCINE) 1000 UNIT/ML DIALYSIS
1000.0000 [IU] | INTRAMUSCULAR | Status: DC | PRN
  Filled 2014-05-05: qty 1

## 2014-05-05 MED ORDER — LIDOCAINE-PRILOCAINE 2.5-2.5 % EX CREA: 1.0000 "application " | TOPICAL_CREAM | CUTANEOUS | Status: DC | PRN

## 2014-05-05 NOTE — Progress Notes (Addendum)
  Progress Note    05/05/2014 7:29 AM 8 Days Post-Op  Subjective:  C/o pain in her left arm, but states it has been there for months and that is is a little more swollen than it has been.  Also c/o right forearm pain-thinks it is from the way they are moving her.  She says she is having pain and points to her vagina and says she doesn't think they are cleaning her very well.  Afebrile x 24 hrs  Filed Vitals:   05/05/14 0500  BP: 107/51  Pulse: 82  Temp: 97.6 F (36.4 C)  Resp: 18    Physical Exam: Lungs:  Non labored Incisions:  Left vac in place left groin with good seal; penrose drain in good position.  Dressings with serous drainage. Right femoral diatek in good condition   CBC    Component Value Date/Time   WBC 10.4 05/03/2014 0649   RBC 3.43* 05/03/2014 0649   HGB 9.4* 05/03/2014 0649   HCT 29.6* 05/03/2014 0649   PLT 61* 05/03/2014 0649   MCV 86.3 05/03/2014 0649   MCH 27.4 05/03/2014 0649   MCHC 31.8 05/03/2014 0649   RDW 21.3* 05/03/2014 0649   LYMPHSABS 1.7 04/18/2014 2102   MONOABS 0.8 04/18/2014 2102   EOSABS 0.1 04/18/2014 2102   BASOSABS 0 04/18/2014 2102    BMET    Component Value Date/Time   NA 144 05/03/2014 0649   K 4.4 05/03/2014 0649   CL 108 05/03/2014 0649   CO2 25 05/03/2014 0649   GLUCOSE 92 05/03/2014 0649   BUN 23 05/03/2014 0649   CREATININE 4.16* 05/03/2014 0649   CALCIUM 8.6 05/03/2014 0649   GFRNONAA 10* 05/03/2014 0649   GFRAA 11* 05/03/2014 0649    INR No results found for: INR   Intake/Output Summary (Last 24 hours) at 05/05/14 0729 Last data filed at 05/05/14 0657  Gross per 24 hour  Intake    480 ml  Output     51 ml  Net    429 ml     Assessment:  78 y.o. female is s/p:  #1 insertion of left internal jugular vein intravenous catheter #2 right chest wall shuntogram #3 removal left thigh graft with bovine patch angioplasty left superficial femoral artery  8 Days Post-Op  Plan: -pt does not walk -DVT  prophylaxis:  None as she is still at high risk for breakdown of her patch angioplasty site.   Continue to use SCD's for DVT prophylaxis -her left arm is slightly larger than her right arm, but pt states the pain has not changed.  She has had multiple access sites in this arm.  Could get venous duplex, but she is high risk for anticoagulants if DVT is present.  Will discuss with Dr. Darrick PennaFields -ck cbc in am -d/w nurse that pt wants to bathe herself and that she may need some assistance to get clean.   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

## 2014-05-05 NOTE — Progress Notes (Signed)
Subjective: no complaints, good appetite  Objective: Vital signs in last 24 hours: Temp:  [97.6 F (36.4 C)-98.1 F (36.7 C)] 97.6 F (36.4 C) (12/17 0500) Pulse Rate:  [78-82] 82 (12/17 0500) Resp:  [18] 18 (12/17 0500) BP: (103-109)/(48-69) 107/51 mmHg (12/17 0500) SpO2:  [100 %] 100 % (12/17 0500) Weight:  [75.8 kg (167 lb 1.7 oz)] 75.8 kg (167 lb 1.7 oz) (12/17 0500) Weight change: -5.1 kg (-11 lb 3.9 oz)  Intake/Output from previous day: 12/16 0701 - 12/17 0700 In: 480 [P.O.:480] Out: 51 [Drains:50; Stool:1] Intake/Output this shift:  Lab Results:  Recent Labs  05/03/14 0649  WBC 10.4  HGB 9.4*  HCT 29.6*  PLT 61*   BMET:   Recent Labs  05/03/14 0649  NA 144  K 4.4  CL 108  CO2 25  GLUCOSE 92  BUN 23  CREATININE 4.16*  CALCIUM 8.6  ALBUMIN 2.1*   No results for input(s): PTH in the last 72 hours. Iron Studies: No results for input(s): IRON, TIBC, TRANSFERRIN, FERRITIN in the last 72 hours.  Studies/Results: No results found.   EXAM: General appearance:  Alert, in no apparent distress Resp:  Coarse bibasilar rales Cardio:  RRR without murmur or rub GI:  + BS, soft and nontender Extremities:  No edema Access:   R femoral catheter  [place] [facility] [place] 3 month stay) 4h 78kg 2/2.0 BAth R femoral Permcath (L thigh AVGG now removed) Heparin 4000 Calcitriol 0.5 ug tiw, Aranesp 100 ug/ wk, Venofer 50 mg / week BP runs low entire HD , "70's - 80's" is typical, starts to feel bad when BP gets "in the 50's"  Assessment: 1. S/P L thigh AVG removal - removed 12/9, placed in November in [place]. No +cultures, on empiric abx.  2. ESRD - HD on TTS schedule. 3. Volume - 4 kg under dry wt now, euvolemic on exam 4. Chronic hypotension - on Midodrine, ^'d to 5 mg bid 5. Anemia - Hgb 9.4, weekly Fe.  Started Aranesp. 6. Thrombocytopenia - acute on chronic 7. AFib 8. Hx CAD / MI - has had 3 heart caths , no stents per  pt 9. Malnutrition - Alb 2.1, improving appetite 10. Hypokalemia - resolved 11. Access - pt has a R upper chest AVG with a bruit; shuntogram done by Dr Darrick PennaFields on 12/9 w contrast injections showed no filling of SVC or innominate veins, there was "some flow in distal subclavian vein and a into a large collateral over the shoulder"    Plan - HD today, will see if the upper chest AVG can be accessed and utilized for HD today.  If not will cont to use HD cath.    Vinson Moselleob Dago Jungwirth MD pager [phone]    cell [phone] 05/05/2014, 11:05 AM

## 2014-05-05 NOTE — Progress Notes (Addendum)
ANTIBIOTIC CONSULT NOTE   Pharmacy Consult for Vancomycin Indication: graft infection  Allergies  Allergen Reactions  . Lactose Intolerance (Gi) Diarrhea  . Penicillins Itching   Labs:  Recent Labs  05/03/14 0649 05/05/14 1200  WBC 10.4  --   HGB 9.4*  --   PLT 61*  --   CREATININE 4.16* 4.64*    Assessment: 78 year old female continues on Vancomycin and Zosyn (started 12/1>) for graft infection.  S/p removol graft 12/9.  Drain and Wound VAC still in place.  Random trough level within goal at 22.5.  WBC wnl, afebrile.    12/1 Vanc >>  Cultures negative to date ESRD -- HD on TThurSat C. Diff negative  Goal of Therapy:  Vancomycin pre-HD level 15-25  Plan:  - Continue Vancomycin 750 mg iv Q HD - Continue to monitor renal function, clinical efficacy, and trough levels when appropriate - F/U abx LOT  .

## 2014-05-06 ENCOUNTER — Telehealth: Payer: Self-pay | Admitting: Vascular Surgery

## 2014-05-06 LAB — CBC
HEMATOCRIT: 30.9 % — AB (ref 36.0–46.0)
HEMOGLOBIN: 9.6 g/dL — AB (ref 12.0–15.0)
MCH: 28.2 pg (ref 26.0–34.0)
MCHC: 31.1 g/dL (ref 30.0–36.0)
MCV: 90.9 fL (ref 78.0–100.0)
Platelets: 99 10*3/uL — ABNORMAL LOW (ref 150–400)
RBC: 3.4 MIL/uL — ABNORMAL LOW (ref 3.87–5.11)
RDW: 22.2 % — AB (ref 11.5–15.5)
WBC: 8.9 10*3/uL (ref 4.0–10.5)

## 2014-05-06 MED ORDER — VANCOMYCIN HCL IN DEXTROSE 750-5 MG/150ML-% IV SOLN: 750.0000 mg | INTRAVENOUS | Status: AC

## 2014-05-06 MED ORDER — BOOST / RESOURCE BREEZE PO LIQD: 1.0000 | Freq: Two times a day (BID) | ORAL | Status: AC

## 2014-05-06 MED ORDER — OXYCODONE-ACETAMINOPHEN 5-325 MG PO TABS: 1.0000 | ORAL_TABLET | Freq: Four times a day (QID) | ORAL | Status: AC | PRN

## 2014-05-06 NOTE — Progress Notes (Signed)
[facility] Progress Note   Subjective: no complaints. We used R chest AVG yesterday, no problems sticking and pre BUN 27 w post BUN 8 for reduction ratio of 70% even. BFR was 300.   Filed Vitals:   05/05/14 1630 05/05/14 1647 05/05/14 2215 05/06/14 0357  BP: 128/59 132/70 97/41 102/42  Pulse: 84 80 94 79  Temp:  97.5 F (36.4 C) 99.3 F (37.4 C) 98 F (36.7 C)  TempSrc:  Oral Oral Oral  Resp: 23 20 20 21   Height:      Weight:  79.1 kg (174 lb 6.1 oz)  72.6 kg (160 lb 0.9 oz)  SpO2:  99% 100% 100%   Exam: General appearance: Alert, in no apparent distress Resp: Coarse bibasilar rales Cardio: RRR without murmur or rub GI: + BS, soft and nontender Extremities: No edema Access: R femoral catheter  [place] [facility] [place] here for 3 month stay) 4h 78kg 2/2.0 BAth R femoral Permcath (L thigh AVGG now removed) Heparin 4000 Calcitriol 0.5 ug tiw, Aranesp 100 ug/ wk, Venofer 50 mg / week BP runs low entire HD , "70's - 80's" is typical, starts to feel bad when BP gets "in the 50's"  Assessment: 1. S/P L thigh AVG removal - removed 12/9, placed in November in [place]. No +cultures, on empiric abx. Will cont IV Vanc thru 12/23 which is 2 weeks post graft removal 2. ESRD - HD TTS 3. Vasc access - used AVG successfully w poor-man's URR of 70% which is good 4. Volume - euvolemic, wt down 73 kg range 5. Chronic hypotension - on Midodrine, ^'d to 5 mg bid 6. Anemia - Hgb 9.4, weekly Fe. Started Aranesp. 7. Thrombocytopenia - acute on chronic 8. AFib 9. Hx CAD / MI - has had 3 heart caths , no stents per pt 10. Malnutrition - Alb 2.1, improving appetite  Plan- HD Sat, use AVG again, if successful will have TDC removed    Vinson Moselleob Benna Arno MD  pager [phone]    cell [phone]  05/06/2014, 10:02 AM     Recent Labs Lab 05/01/14 0800 05/03/14 0649 05/05/14 1200 05/05/14 1600  NA 140 144 139  --   K 3.0* 4.4 3.8  --   CL 101 108 101  --    CO2 25 25 25   --   GLUCOSE 92 92 99  --   BUN 13 23 27*  27* 8  CREATININE 3.57* 4.16* 4.64*  --   CALCIUM 8.3* 8.6 8.5  --   PHOS 3.3 3.5 4.1  --     Recent Labs Lab 05/01/14 0800 05/03/14 0649 05/05/14 1200  ALBUMIN 2.2* 2.1* 2.1*    Recent Labs Lab 04/30/14 0714 05/03/14 0649 05/06/14 0845  WBC 10.5 10.4 8.9  HGB 10.5* 9.4* 9.6*  HCT 33.1* 29.6* 30.9*  MCV 85.3 86.3 90.9  PLT 71* 61* PENDING   . aspirin EC  81 mg Oral Q24H  . calcitRIOL  0.5 mcg Oral Q T,Th,Sa-HD  . clonazePAM  1 mg Oral Q24H  . darbepoetin (ARANESP) injection - DIALYSIS  25 mcg Intravenous Q Tue-HD  . DULoxetine  60 mg Oral Q24H  . feeding supplement (RESOURCE BREEZE)  1 Container Oral BID BM  . ferric gluconate (FERRLECIT/NULECIT) IV  62.5 mg Intravenous Q Thu-HD  . midodrine  5 mg Oral BID  . multivitamin  1 tablet Oral QHS  . pantoprazole  40 mg Oral Q24H  . pravastatin  20 mg Oral  Q24H  . sodium chloride  10-40 mL Intracatheter Q12H  . traZODone  150 mg Oral Q24H  . vancomycin  750 mg Intravenous Q T,Th,Sa-HD  . zonisamide  100 mg Oral TID   . sodium chloride Stopped (05/01/14 0300)   acetaminophen **OR** acetaminophen, ALPRAZolam, bisacodyl, bisacodyl, guaiFENesin-dextromethorphan, loperamide, morphine injection, ondansetron, oxyCODONE-acetaminophen, phenol, sodium chloride

## 2014-05-06 NOTE — Discharge Summary (Signed)
Discharge Summary    Erika Maddox 10/09/1935 78 y.o. female  [id number]  Admission Date: 04/18/2014  Discharge Date: 05/06/14  Physician: Sherren Kerns, MD  Admission Diagnosis: Renal failure [N19] Post op infection [T81.4XXA] Preop cardiovascular exam [Z01.810]   HPI:   This is a 78 y.o. female who presents for evaluation of left groin drainage. Pt had left thigh graft placed in [place] approximately 2 weeks ago. Began to have some pain and clear drainage from groin today. She is planning on staying in the area for the next 3 months. She currently is dialyzing T Th Sat on [facility]. She denies fever or chills. She has had no nausea or vomiting. Other medical problems include atrial fibrillation (she is not clear on why coumadin was stopped), fibromyalgia, arthritis. Her cause of renal failure was from NSAIDS for her arthritis. She has been on dialysis approximately 4 years. She has had multiple failed arm grafts. She currently is dialyzing via a right femoral catheter. She is essentially non ambulatory and uses a wheelchair.  Hospital Course:  The patient was admitted to the hospital and wound vac placed on the wound with copious amounts of drainage.  This did continue to drain.  She was also started on ABx.  She is malnourished and was started on a supplement.  A renal consult was also obtained and she did undergo HD while in the hospital.  Her drainage did not improve and on hospital day 8, she did have exposed graft and she was taken to the operating room on 04/27/2014 and underwent:  #1 insertion of left internal jugular vein intravenous catheter #2 right chest wall shuntogram #3 removal left thigh graft with bovine patch angioplasty left superficial femoral artery    The pt tolerated the procedure well and was transported to the PACU in good condition.  She was transferred to the ICU to get meticulous wound care as she is very high risk for breakdown of her  arterial repair and bleeding due to severe protein calorie nutrition.  She was not placed on any anticoagulation postoperatively due to her high risk of bleeding.   She did have penrose drains in and these were slowly removed over several days.    Central line is intentionally in azygos vein apparently able to infuse but not sample blood. PT SVC and bilateral innominate vein occluded. Will leave in place but minimize blood draws.  Pt was transferred to the telemetry floor on 05/03/14.  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    By POD 9, there was finally some improvement in her left groin wound and there was minimal drainage.  The wound vac was available for home and the penrose drains are removed and she is discharged home with home health.  She will receive Vancomycin for one month on HD and I have spoken directly to [place], renal PA to confirm this.    CBC    Component Value Date/Time   WBC 8.9 05/06/2014 0845   RBC 3.40* 05/06/2014 0845   HGB 9.6* 05/06/2014 0845   HCT 30.9* 05/06/2014 0845   PLT 99* 05/06/2014 0845   MCV 90.9 05/06/2014 0845   MCH 28.2 05/06/2014 0845   MCHC 31.1 05/06/2014 0845   RDW 22.2* 05/06/2014 0845   LYMPHSABS 1.7 04/18/2014 2102   MONOABS 0.8 04/18/2014 2102  EOSABS 0.1 04/18/2014 2102   BASOSABS 0 04/18/2014 2102    BMET    Component Value Date/Time   NA 139 05/05/2014 1200   K 3.8 05/05/2014 1200   CL 101 05/05/2014 1200   CO2 25 05/05/2014 1200   GLUCOSE 99 05/05/2014 1200   BUN 8 05/05/2014 1600   CREATININE 4.64* 05/05/2014 1200   CALCIUM 8.5 05/05/2014 1200   GFRNONAA 8* 05/05/2014 1200   GFRAA 10* 05/05/2014 1200     Discharge Instructions:   The patient is discharged with extensive instructions on wound care and progressive ambulation.  They are instructed not to drive or perform  any heavy lifting until returning to see the physician in his office.  Discharge Instructions    Call MD for:  redness, tenderness, or signs of infection (pain, swelling, bleeding, redness, odor or green/yellow discharge around incision site)    Complete by:  As directed      Call MD for:  severe or increased pain, loss or decreased feeling  in affected limb(s)    Complete by:  As directed      Call MD for:  temperature >100.5    Complete by:  As directed      Change dressing (specify)    Complete by:  As directed   Wound vac change M/W/F.     Discharge wound care:    Complete by:  As directed   Dry dressing to drain site daily and as needed.     Driving Restrictions    Complete by:  As directed   No driving at least until returning to see Dr. Eula ListenFields     Home Health    Complete by:  As directed   To provide the following care/treatments:  RN  Needs wound vac change to left groin M/W/F.     Lifting restrictions    Complete by:  As directed   No lifting for 4 weeks     Resume previous diet    Complete by:  As directed   Drink Resource Breeze three times daily to improve nutrition for better wound healing.           Discharge Diagnosis:  Renal failure [N19] Post op infection [T81.4XXA] Preop cardiovascular exam [Z01.810]  Secondary Diagnosis: Patient Active Problem List   Diagnosis Date Noted  . ESRD on hemodialysis 05/02/2014  . Arteriovenous graft infection 04/18/2014   Past Medical History  Diagnosis Date  . Renal disorder   . Dialysis patient   . Atrial fibrillation   . Fibromyalgia   . Hypertension        Medication List    STOP taking these medications        HYDROcodone-acetaminophen 5-325 MG per tablet  Commonly known as:  NORCO/VICODIN      TAKE these medications        ALPRAZolam 0.25 MG tablet  Commonly known as:  XANAX  Take 0.25 mg by mouth daily as needed for anxiety (TAKES BEFORE DIALYSIS ONLY).     aspirin EC 81 MG tablet  Take 81 mg  by mouth daily.     clonazePAM 1 MG tablet  Commonly known as:  KLONOPIN  Take 1 mg by mouth at bedtime.     clopidogrel 75 MG tablet  Commonly known as:  PLAVIX  Take 75 mg by mouth daily.     CVS BISACODYL 5 MG EC tablet  Generic drug:  bisacodyl  Take 5 mg by mouth daily as needed for  moderate constipation.     DULoxetine 60 MG capsule  Commonly known as:  CYMBALTA  Take 60 mg by mouth daily.     feeding supplement (RESOURCE BREEZE) Liqd  Take 1 Container by mouth 2 (two) times daily between meals.     midodrine 5 MG tablet  Commonly known as:  PROAMATINE  Take 5 mg by mouth daily.     oxyCODONE-acetaminophen 5-325 MG per tablet  Commonly known as:  PERCOCET/ROXICET  Take 1-2 tablets by mouth every 6 (six) hours as needed for moderate pain.     pravastatin 20 MG tablet  Commonly known as:  PRAVACHOL  Take 20 mg by mouth at bedtime.     sucralfate 1 G tablet  Commonly known as:  CARAFATE  Take 1 g by mouth 4 (four) times daily.     traZODone 150 MG tablet  Commonly known as:  DESYREL  Take 150 mg by mouth at bedtime.     Vancomycin 750 MG/150ML Soln  Commonly known as:  VANCOCIN  Inject 150 mLs (750 mg total) into the vein Every Tuesday,Thursday,and Saturday with dialysis.     zonisamide 100 MG capsule  Commonly known as:  ZONEGRAN  Take 100 mg by mouth 3 (three) times daily.        Percocet #30 No Refill Resource Breeze feeding supplement bid to promote nutrition and wound healing  Disposition: home with wound vac and HHRN  Patient's condition: is Fair  Follow up: 1. Dr. Darrick PennaFields in 2 weeks   Doreatha MassedSamantha Kanika Bungert, PA-C [facility] [phone] 05/06/2014  2:57 PM

## 2014-05-06 NOTE — Progress Notes (Addendum)
  Progress Note    05/06/2014 7:22 AM 9 Days Post-Op  Subjective:  Sleeping-awakes easily.  No complaints.  She is pleased they were able to use her chest graft for HD without difficulty  Tm 99 now 98 90's-130's systolic HR  80's-90's AFib  100% 2LO2NC  Filed Vitals:   05/06/14 0357  BP: 102/42  Pulse: 79  Temp: 98 F (36.7 C)  Resp: 21    Physical Exam: Lungs:  Non labored Incisions:  Penrose drains in place-appeared to not have as much drainage on today (bandage changed last night); wound vac continues to have goo seal.  CBC    Component Value Date/Time   WBC 10.4 05/03/2014 0649   RBC 3.43* 05/03/2014 0649   HGB 9.4* 05/03/2014 0649   HCT 29.6* 05/03/2014 0649   PLT 61* 05/03/2014 0649   MCV 86.3 05/03/2014 0649   MCH 27.4 05/03/2014 0649   MCHC 31.8 05/03/2014 0649   RDW 21.3* 05/03/2014 0649   LYMPHSABS 1.7 04/18/2014 2102   MONOABS 0.8 04/18/2014 2102   EOSABS 0.1 04/18/2014 2102   BASOSABS 0 04/18/2014 2102    BMET    Component Value Date/Time   NA 139 05/05/2014 1200   K 3.8 05/05/2014 1200   CL 101 05/05/2014 1200   CO2 25 05/05/2014 1200   GLUCOSE 99 05/05/2014 1200   BUN 8 05/05/2014 1600   CREATININE 4.64* 05/05/2014 1200   CALCIUM 8.5 05/05/2014 1200   GFRNONAA 8* 05/05/2014 1200   GFRAA 10* 05/05/2014 1200    INR No results found for: INR   Intake/Output Summary (Last 24 hours) at 05/06/14 [id number] Last data filed at 05/05/14 2216  Gross per 24 hour  Intake    480 ml  Output   2599 ml  Net  -2119 ml     Assessment:  78 y.o. female is s/p:  #1 insertion of left internal jugular vein intravenous catheter #2 right chest wall shuntogram #3 removal left thigh graft with bovine patch angioplasty left superficial femoral artery   9 Days Post-Op  Plan: -pt doing well this am-the penrose drains were pulled back this am.  I did not remove the wound vac as Dr. Darrick PennaFields is going to want to inspect the wound-will wait until he is  available. -asked RN about amount of drainage is out of wound vac-she will check and chart the amount. -continue ABx -DVT prophylaxis:  None at this time-still at high risk for breakdown on patch angioplasty site   Doreatha MassedSamantha Rhyne, PA-C [facility] [phone] 05/06/2014 7:22 AM    Groin wound finally seems to be improving VAC removed today, minimal drainage expressible Will continue to pull penrose drains back daily Need to set up home [facility]  Fabienne Brunsharles Kaari Zeigler, MD [facility] [place] Office: [phone] Pager: [phone]

## 2014-05-06 NOTE — Telephone Encounter (Addendum)
-----   Message from Sharee PimpleMarilyn K McChesney, RN sent at 05/06/2014  3:02 PM EST ----- Regarding: Schedule   ----- Message -----    From: Dara LordsSamantha J Rhyne, PA-C    Sent: 05/06/2014   2:54 PM      To: Vvs Charge Pool  S/p removal of left AVG (thigh) now with wound vac.  Needs to see Dr. Darrick PennaFields in 2 weeks.  Thanks, Samantha   05/06/14: lm for pt re appt, dpm

## 2014-05-06 NOTE — Progress Notes (Signed)
Attempted to contact Ricky @ [phone] [facility] due to patient being ready for discharge. Received no answer and message left requesting a return call. Bradley FerrisBrandie Charly Hunton RN BSN 05/06/2014 6:31 PM

## 2014-05-06 NOTE — Progress Notes (Addendum)
Left IJ CVC was placed on 04/27/14. Per x-ray on 12/9 tip was in the left innominate vein and advancement was recommended per the radiologist. Per x-ray on 04/30/14 the tip of the line was over the aortic knob. Assessed central line this am and flushed all 3 ports with no blood return from any of the ports. Spoke with pt.'s nurse Eliseo GumBrandie, RN and recommended that she call the MD and get an order to remove the line or repeat x-ray to verify tip placement. Called lab and made them aware to draw cbc ordered

## 2014-05-06 NOTE — Progress Notes (Signed)
Very little output from VAC over night. Looks like maybe 25cc. Per night shift RN, wound VAC kept having low suction. Appears to be working okay right now. Per PA, Dr. Darrick PennaFields will be by later today to look at it. RN will continue to monitor. Bradley FerrisBrandie Koy Lamp RN BSN 05/06/2014 7:42 AM

## 2014-05-06 NOTE — Consult Note (Addendum)
WOC follow-up: Bedside nurse states Dr Darrick PennaFields was in this AM to remove Vac to left groin and assess wound.  He requested it be reapplied and bedside nurse replaced Vac dressing at that time. Dr Darrick PennaFields plans to discharge patient with Vac. Please refer to VVS team for further plan of care. Please re-consult if further assistance is needed.  Thank-you,  Cammie Mcgeeawn Marie Chow MSN, RN, CWOCN, [place]CN-AP, CNS [phone]

## 2014-05-06 NOTE — Progress Notes (Signed)
Spoke with Dr. Darrick PennaFields regarding patients R IJ not having any blood return. Per MD, try to obtain a PIV and then IJ can come out. Bradley FerrisBrandie Dametri Ozburn RN BSN 05/06/2014 10:15 AM

## 2014-05-18 ENCOUNTER — Emergency Department (HOSPITAL_COMMUNITY)
Admission: EM | Admit: 2014-05-18 | Discharge: 2014-05-19 | Disposition: A | Discharge status: Home / Self Care | Payer: Medicare HMO | Attending: Emergency Medicine | Admitting: Emergency Medicine

## 2014-05-18 DIAGNOSIS — Z992 Dependence on renal dialysis: Secondary | ICD-10-CM | POA: Diagnosis not present

## 2014-05-18 DIAGNOSIS — T82898A Other specified complication of vascular prosthetic devices, implants and grafts, initial encounter: Secondary | ICD-10-CM | POA: Diagnosis present

## 2014-05-18 DIAGNOSIS — K5900 Constipation, unspecified: Secondary | ICD-10-CM | POA: Insufficient documentation

## 2014-05-18 DIAGNOSIS — Z79899 Other long term (current) drug therapy: Secondary | ICD-10-CM | POA: Diagnosis not present

## 2014-05-18 DIAGNOSIS — T82838A Hemorrhage of vascular prosthetic devices, implants and grafts, initial encounter: Secondary | ICD-10-CM | POA: Insufficient documentation

## 2014-05-18 DIAGNOSIS — Z7982 Long term (current) use of aspirin: Secondary | ICD-10-CM | POA: Insufficient documentation

## 2014-05-18 DIAGNOSIS — M797 Fibromyalgia: Secondary | ICD-10-CM | POA: Insufficient documentation

## 2014-05-18 DIAGNOSIS — I120 Hypertensive chronic kidney disease with stage 5 chronic kidney disease or end stage renal disease: Secondary | ICD-10-CM | POA: Diagnosis not present

## 2014-05-18 DIAGNOSIS — Z7902 Long term (current) use of antithrombotics/antiplatelets: Secondary | ICD-10-CM | POA: Diagnosis not present

## 2014-05-18 DIAGNOSIS — N186 End stage renal disease: Secondary | ICD-10-CM | POA: Insufficient documentation

## 2014-05-18 DIAGNOSIS — Z880 Allergy status to penicillin: Secondary | ICD-10-CM | POA: Insufficient documentation

## 2014-05-18 DIAGNOSIS — Y841 Kidney dialysis as the cause of abnormal reaction of the patient, or of later complication, without mention of misadventure at the time of the procedure: Secondary | ICD-10-CM | POA: Insufficient documentation

## 2014-05-18 NOTE — ED Notes (Signed)
Pt arrives from home via EMS with c/o of sudden onset bleeding from R thigh dialysis graft. Appears to be a cut in the tubing, also drainage from insertion site. Pt recently admitted to our facility for staph infection in L thigh graft, still has wound vac in place. Pt has been afebrile, VSS. On 2L at home. T, TH, SAT dialysis schedule.

## 2014-05-18 NOTE — ED Provider Notes (Signed)
CSN: [id number]     Arrival date & time 05/18/14  2345 History  This chart was scribed for Vanetta Mulders, MD by Annye Asa, ED Scribe. This patient was seen in room A01C/A01C and the patient's care was started at 12:11 AM   Chief Complaint  Patient presents with  . Vascular Access Problem   Patient is a 79 y.o. female presenting with injury. The history is provided by the patient and the spouse. No language interpreter was used.  Injury This is a new problem. The current episode started less than 1 hour ago. The problem occurs constantly. The problem has been gradually improving. Associated symptoms include headaches (Chronic). Pertinent negatives include no chest pain and no abdominal pain. Nothing relieves the symptoms. Treatments tried: applied pressure to control bleeding. The treatment provided moderate relief.     HPI Comments: Erika Maddox is a 78 y.o. female who presents to the Emergency Department complaining of bleeding to a thigh dialysis graft (right groin) and a wound vac site (left groin). Husband reports that the bleeding began around 23:30 tonight; patient was concerned and called EMS.   Patient has been receiving dialysis for the past four years; she is regularly dialyzed on Tues, Thurs, Sat. She has a temporary access site in her left groin due to a prior clot in another site - when the original site became usable during a recent hospital stay, the site in her left groin was abandoned, became infected, and was later removed by Dr. Darrick Penna - she has a Hemovac in place at this time. The collection tube was replaced around 15:00 today and has not collected blood since; instead, blood seeps to be oozing out of the insertion site.   Her right graft appears to have slightly dislodged earlier this evening, resulting in bleeding at the insertion site.  Patient utilizes [place] 2L/min at home.   Past Medical History  Diagnosis Date  . Renal disorder   . Dialysis patient   .  Atrial fibrillation   . Fibromyalgia   . Hypertension    Past Surgical History  Procedure Laterality Date  . Cardiac surgery    . Cholecystectomy    . Abdominal hysterectomy    . Cardiac catheterization Left 04/27/2014    Procedure: CENTRAL LINE INSERTION;  Surgeon: Sherren Kerns, MD;  Location[facility] OR;  Service: Vascular;  Laterality: Left;  . Fistulogram Right 04/27/2014    Procedure: SHUNTOGRAM OF RIGHT CHEST ARTERIOVENOUS GRAFT;  Surgeon: Sherren Kerns, MD;  Location[facility] OR;  Service: Vascular;  Laterality: Right;  . Avgg removal Left 04/27/2014    Procedure: REMOVAL OF ARTERIOVENOUS GORETEX GRAFT (AVGG);  Surgeon: Sherren Kerns, MD;  Location: [facility] OR;  Service: Vascular;  Laterality: Left;   No family history on file. History  Substance Use Topics  . Smoking status: Never Smoker   . Smokeless tobacco: Not on file  . Alcohol Use: Not on file   OB History    No data available     Review of Systems  Constitutional: Negative for fever and chills.  HENT: Negative for rhinorrhea and sore throat.   Respiratory: Negative for cough.   Cardiovascular: Negative for chest pain.  Gastrointestinal: Positive for nausea and constipation. Negative for vomiting, abdominal pain and diarrhea.  Musculoskeletal: Negative for back pain.  Skin: Negative for rash.  Neurological: Positive for headaches (Chronic).  Hematological: Bruises/bleeds easily.  Psychiatric/Behavioral: Negative for confusion.    Allergies  Lactose intolerance (gi) and Penicillins  Home Medications   Prior to Admission medications   Medication Sig Start Date End Date Taking? Authorizing Provider  ALPRAZolam (XANAX) 0.25 MG tablet Take 0.25 mg by mouth daily as needed for anxiety (TAKES BEFORE DIALYSIS ONLY).  03/30/14   Historical Provider, MD  aspirin EC 81 MG tablet Take 81 mg by mouth daily.    Historical Provider, MD  clonazePAM (KLONOPIN) 1 MG tablet Take 1 mg by mouth at bedtime.  01/20/14   Historical  Provider, MD  clopidogrel (PLAVIX) 75 MG tablet Take 75 mg by mouth daily. 04/05/14   Historical Provider, MD  CVS BISACODYL 5 MG EC tablet Take 5 mg by mouth daily as needed for moderate constipation.  03/08/14   Historical Provider, MD  DULoxetine (CYMBALTA) 60 MG capsule Take 60 mg by mouth daily. 02/26/14   Historical Provider, MD  feeding supplement, RESOURCE BREEZE, (RESOURCE BREEZE) LIQD Take 1 Container by mouth 2 (two) times daily between meals. 05/06/14   Samantha J Rhyne, PA-C  midodrine (PROAMATINE) 5 MG tablet Take 5 mg by mouth daily. 04/07/14   Historical Provider, MD  oxyCODONE-acetaminophen (PERCOCET/ROXICET) 5-325 MG per tablet Take 1-2 tablets by mouth every 6 (six) hours as needed for moderate pain. 05/06/14   Samantha J Rhyne, PA-C  pravastatin (PRAVACHOL) 20 MG tablet Take 20 mg by mouth at bedtime.  02/11/14   Historical Provider, MD  sucralfate (CARAFATE) 1 G tablet Take 1 g by mouth 4 (four) times daily. 04/08/14   Historical Provider, MD  traZODone (DESYREL) 150 MG tablet Take 150 mg by mouth at bedtime. 02/11/14   Historical Provider, MD  Vancomycin (VANCOCIN) 750 MG/150ML SOLN Inject 150 mLs (750 mg total) into the vein Every Tuesday,Thursday,and Saturday with dialysis. 05/06/14   Samantha J Rhyne, PA-C  zonisamide (ZONEGRAN) 100 MG capsule Take 100 mg by mouth 3 (three) times daily. 04/02/14   Historical Provider, MD   BP 109/51 mmHg  Pulse 85  Temp(Src) 98 F (36.7 C) (Oral)  Resp 12  SpO2 100% Physical Exam  Constitutional: She is oriented to person, place, and time. She appears well-developed and well-nourished.  HENT:  Head: Normocephalic and atraumatic.  Mouth/Throat: Oropharynx is clear and moist. No oropharyngeal exudate.  Eyes: EOM are normal. Pupils are equal, round, and reactive to light.  Cardiovascular: Normal rate, regular rhythm and normal heart sounds.  Exam reveals no gallop and no friction rub.   No murmur heard. Pulmonary/Chest: Effort normal  and breath sounds normal. No respiratory distress. She has no wheezes. She has no rales.  Abdominal: Soft. There is no tenderness. There is no rebound and no guarding.  Musculoskeletal: Normal range of motion. She exhibits no edema.  Neurological: She is alert and oriented to person, place, and time.  Skin: Skin is warm and dry. No rash noted.  Temporary access catheter right groin, bleeding controlled at present; Hemovac in right groin slightly oozing blood   Psychiatric: She has a normal mood and affect. Her behavior is normal.  Nursing note and vitals reviewed.   ED Course  Procedures .  DIAGNOSTIC STUDIES: Oxygen Saturation is 100% on [place] 2L/min, normal by my interpretation.    COORDINATION OF CARE: 12:30 AM Discussed treatment plan with pt at bedside and pt agreed to plan.  2:48 AM Nursing team reports that bleeding is controlled to the temp cath site on the right groin. Also of note, the wound vac on the left groin is functioning appropriately at this time.   Labs Review  Labs Reviewed - No data to display  Imaging Review No results found.   EKG Interpretation None      MDM   Final diagnoses:  ESRD on hemodialysis   Patient with bleeding from dialysis catheter site in the right groin that actually was out of her skin. Pressure dressing applied bleeding controlled now. Patient also with a Hemovac in the left groin area following hematoma. That was not functioning that got solved by nurses upstairs and is now functioning again and draining blood properly. Patient can be discharged home follow-up with her regular doctors. Continue her dialysis schedule. Patient was not using the dialysis catheter for dialysis.    I personally performed the services described in this documentation, which was scribed in my presence. The recorded information has been reviewed and is accurate.     Vanetta MuldersScott Phala Schraeder, MD 05/19/14 [id number]

## 2014-05-19 ENCOUNTER — Encounter (HOSPITAL_COMMUNITY): Payer: Self-pay | Admitting: Emergency Medicine

## 2014-05-19 NOTE — Discharge Instructions (Signed)
Follow-up with your dialysis as scheduled. Return for any further bleeding from the right groin. Continue to monitor the Hemovac in the left groin to make sure he continues to function properly.

## 2014-05-25 ENCOUNTER — Encounter: Payer: Self-pay | Admitting: Vascular Surgery

## 2014-05-26 ENCOUNTER — Encounter: Payer: Self-pay | Admitting: Vascular Surgery

## 2014-05-26 ENCOUNTER — Encounter: Payer: Medicare HMO | Admitting: Vascular Surgery

## 2014-05-26 ENCOUNTER — Ambulatory Visit (INDEPENDENT_AMBULATORY_CARE_PROVIDER_SITE_OTHER): Payer: Self-pay | Admitting: Vascular Surgery

## 2014-05-26 VITALS — BP 147/73 | HR 84 | Resp 14 | Ht 62.0 in | Wt 200.6 lb

## 2014-05-26 DIAGNOSIS — Z992 Dependence on renal dialysis: Secondary | ICD-10-CM

## 2014-05-26 DIAGNOSIS — N186 End stage renal disease: Secondary | ICD-10-CM

## 2014-05-26 NOTE — Progress Notes (Signed)
Here today for follow-up of removal of left femoral loop AV graft and placement of a right-sided dialysis catheter. Had extensive past history of renal access. She did have a VAC placement time of her surgery. The VAC is in place. This was changed yesterday evening and per home health outlook is associated granulating base.  On physical exam there is no surrounding erythema. She does have a large pannus. No tenderness and no inflammation. I did not remove her back since it was just placed yesterday evening. She will continue this for additional 2 weeks and will see Dr. Darrick Pennafields for further follow-up.  She reports she is having no difficulty with her catheter for hemodialysis

## 2014-06-03 ENCOUNTER — Telehealth: Payer: Self-pay | Admitting: *Deleted

## 2014-06-03 NOTE — Telephone Encounter (Signed)
I spoke to Erika Maddox, nurse with [facility], She saw Erika Maddox today and said that the wound is almost healed. She has 2 areas that are less than 1 cm in diameter and 0.2cm in depth. They are not tunneling and the surrounding skin is not red or erythematous. Patient is afebrile. I discussed d/c of VAC with Dr. Imogene Burnhen and he said that would be fine and we will switch to wet to dry dressing. Erika MessierCora will instruct patient or family member to do this dressing change on a daily basis: she will give them information regarding wound cleansing and S/S of infection. Erika Maddox voiced understanding and read back this order to me. Patient will keep her appt with Dr. Darrick PennaFields on 06-16-13. She will call our office if any problems occur prior.

## 2014-06-15 ENCOUNTER — Encounter: Payer: Self-pay | Admitting: Vascular Surgery

## 2014-06-16 ENCOUNTER — Encounter: Payer: Self-pay | Admitting: Vascular Surgery

## 2014-06-16 ENCOUNTER — Ambulatory Visit (INDEPENDENT_AMBULATORY_CARE_PROVIDER_SITE_OTHER): Payer: Self-pay | Admitting: Vascular Surgery

## 2014-06-16 VITALS — BP 127/62 | HR 76 | Ht 62.0 in | Wt 200.0 lb

## 2014-06-16 DIAGNOSIS — N186 End stage renal disease: Secondary | ICD-10-CM

## 2014-06-16 DIAGNOSIS — Z992 Dependence on renal dialysis: Secondary | ICD-10-CM

## 2014-06-16 NOTE — Progress Notes (Signed)
Patient is a 79 year old female who had removal of a left thigh graft and placement of a left internal jugular vein catheter on December 9. 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 She returns today for follow-up. She had severe protein calorie malnutrition at the time of her graft removal. She states that she still is not eating much currently. Her catheter has been removed by the nephrologist. She is currently dialyzing via a right chest wall graft. She denies any drainage from the incisions in her left groin. Patient is on home oxygen.  Physical exam:  Filed Vitals:   06/16/14 0959  BP: 127/62  Pulse: 76  Height: 5\' 2"  (1.575 m)  Weight: 200 lb (90.719 kg)  SpO2: 99%   Abdomen: Obese Extremities: Left groin incision well-healed no drainage no erythema  Assessment: Healed left groin after thigh graft removal for infection. Patient has bilateral upper extremity central vein occlusions. She is not a candidate for placement of the left thigh graft due to recent infection. She is a poor candidate for placement of a right thigh graft due to prior history of infection which I am sure was related to severe protein calorie malnutrition which is not really improved currently. Additionally her obesity with pannus overlap of her right groin would put her at high risk of infection for a right thigh graft. The patient plans to return home in March to [place]. She will follow-up with us on an as-needed basis.  Plan: See above  Fabienne Brunsharles Jahzaria Vary, MD [facility] Office: [phone] Pager: [phone]

## 2014-07-05 ENCOUNTER — Emergency Department (HOSPITAL_COMMUNITY)
Admission: EM | Admit: 2014-07-05 | Discharge: 2014-07-05 | Disposition: A | Discharge status: Home / Self Care | Payer: Medicare HMO | Source: Home / Self Care | Attending: Family Medicine | Admitting: Family Medicine

## 2014-07-05 ENCOUNTER — Encounter (HOSPITAL_COMMUNITY): Payer: Self-pay | Admitting: Emergency Medicine

## 2014-07-05 DIAGNOSIS — L723 Sebaceous cyst: Secondary | ICD-10-CM

## 2014-07-05 DIAGNOSIS — L089 Local infection of the skin and subcutaneous tissue, unspecified: Secondary | ICD-10-CM

## 2014-07-05 MED ORDER — LIDOCAINE HCL (PF) 1 % IJ SOLN
INTRAMUSCULAR | Status: AC
  Filled 2014-07-05: qty 5

## 2014-07-05 MED ORDER — HYDROCODONE-ACETAMINOPHEN 5-325 MG PO TABS: 1.0000 | ORAL_TABLET | ORAL | Status: AC | PRN

## 2014-07-05 MED ORDER — CLINDAMYCIN HCL 300 MG PO CAPS: 300.0000 mg | ORAL_CAPSULE | Freq: Three times a day (TID) | ORAL | Status: AC

## 2014-07-05 NOTE — ED Notes (Signed)
Reports bump in right ear x 3 days.  Mild pain.   Pt has tried warm compresses with no relief.

## 2014-07-05 NOTE — ED Provider Notes (Signed)
CSN: [id number]     Arrival date & time 07/05/14  4098 History   First MD Initiated Contact with Patient 07/05/14 1049     Chief Complaint  Patient presents with  . Ear Problem   (Consider location/radiation/quality/duration/timing/severity/associated sxs/prior Treatment) HPI Comments: Complains of a tender painful knot in the right ear canal for 2-3 days   Past Medical History  Diagnosis Date  . Renal disorder   . Dialysis patient   . Atrial fibrillation   . Fibromyalgia   . Hypertension    Past Surgical History  Procedure Laterality Date  . Cardiac surgery    . Cholecystectomy    . Abdominal hysterectomy    . Cardiac catheterization Left 04/27/2014    Procedure: CENTRAL LINE INSERTION;  Surgeon: Sherren Kerns, MD;  Location: [facility] OR;  Service: Vascular;  Laterality: Left;  . Fistulogram Right 04/27/2014    Procedure: SHUNTOGRAM OF RIGHT CHEST ARTERIOVENOUS GRAFT;  Surgeon: Sherren Kerns, MD;  Location: [facility] OR;  Service: Vascular;  Laterality: Right;  . Avgg removal Left 04/27/2014    Procedure: REMOVAL OF ARTERIOVENOUS GORETEX GRAFT (AVGG);  Surgeon: Sherren Kerns, MD;  Location: [facility] OR;  Service: Vascular;  Laterality: Left;   History reviewed. No pertinent family history. History  Substance Use Topics  . Smoking status: Never Smoker   . Smokeless tobacco: Never Used  . Alcohol Use: No   OB History    No data available     Review of Systems  Constitutional: Negative.   HENT: Negative for ear discharge and sore throat.        Right EAC pain  Respiratory: Negative.   Cardiovascular: Negative.   Skin: Negative.     Allergies  Lactose intolerance (gi) and Penicillins  Home Medications   Prior to Admission medications   Medication Sig Start Date End Date Taking? Authorizing Provider  ALPRAZolam (XANAX) 0.25 MG tablet Take 0.25 mg by mouth daily as needed for anxiety (TAKES BEFORE DIALYSIS ONLY).  03/30/14   Historical Provider, MD  aspirin EC 81 MG tablet  Take 81 mg by mouth daily.    Historical Provider, MD  clindamycin (CLEOCIN) 300 MG capsule Take 1 capsule (300 mg total) by mouth 3 (three) times daily. One bid for 6 days 07/05/14   Hayden Rasmussen, NP  clonazePAM (KLONOPIN) 1 MG tablet Take 1 mg by mouth at bedtime.  01/20/14   Historical Provider, MD  clopidogrel (PLAVIX) 75 MG tablet Take 75 mg by mouth daily. 04/05/14   Historical Provider, MD  [facility] BISACODYL 5 MG EC tablet Take 5 mg by mouth daily as needed for moderate constipation.  03/08/14   Historical Provider, MD  DULoxetine (CYMBALTA) 60 MG capsule Take 60 mg by mouth daily. 02/26/14   Historical Provider, MD  feeding supplement, RESOURCE BREEZE, (RESOURCE BREEZE) LIQD Take 1 Container by mouth 2 (two) times daily between meals. 05/06/14   Samantha J Rhyne, PA-C  HYDROcodone-acetaminophen (NORCO/VICODIN) 5-325 MG per tablet Take 1 tablet by mouth every 4 (four) hours as needed. 07/05/14   Hayden Rasmussen, NP  midodrine (PROAMATINE) 5 MG tablet Take 5 mg by mouth daily. 04/07/14   Historical Provider, MD  oxyCODONE-acetaminophen (PERCOCET/ROXICET) 5-325 MG per tablet Take 1-2 tablets by mouth every 6 (six) hours as needed for moderate pain. 05/06/14   Samantha J Rhyne, PA-C  pravastatin (PRAVACHOL) 20 MG tablet Take 20 mg by mouth at bedtime.  02/11/14   Historical Provider, MD  sucralfate (CARAFATE) 1 G tablet  Take 1 g by mouth 4 (four) times daily. 04/08/14   Historical Provider, MD  traZODone (DESYREL) 150 MG tablet Take 150 mg by mouth at bedtime. 02/11/14   Historical Provider, MD  Vancomycin (VANCOCIN) 750 MG/150ML SOLN Inject 150 mLs (750 mg total) into the vein Every Tuesday,Thursday,and Saturday with dialysis. 05/06/14   Samantha J Rhyne, PA-C  zonisamide (ZONEGRAN) 100 MG capsule Take 100 mg by mouth 3 (three) times daily. 04/02/14   Historical Provider, MD   BP 163/95 mmHg  Pulse 110  Temp(Src) 98.1 F (36.7 C) (Oral)  Resp 16  SpO2 98% Physical Exam  Constitutional: She is oriented to  person, place, and time. She appears well-developed and well-nourished. No distress.  HENT:  Right external ear normal. There is a flesh-colored cystic lesion from the anterior wall of the right EAC near the external meatus. It is tender. No current drainage. No bleeding. The TM is normal. No apparent involvement of the pena.  Neck: Normal range of motion. Neck supple.  Pulmonary/Chest: She is in respiratory distress.  Neurological: She is alert and oriented to person, place, and time.  Skin: Skin is warm.  Psychiatric: She has a normal mood and affect.  Nursing note and vitals reviewed.   ED Course  INCISION AND DRAINAGE Date/Time: 07/05/2014 11:32 AM Performed by: Phineas RealMABE, Trissa Molina Authorized by: Ozella RocksMERRELL, Kalan Rinn J Consent: Verbal consent obtained. Risks and benefits: risks, benefits and alternatives were discussed Consent given by: patient Patient understanding: patient states understanding of the procedure being performed Patient identity confirmed: verbally with patient Type: cyst Body area: head/neck Location details: right external ear Anesthesia: local infiltration Local anesthetic: lidocaine 2% without epinephrine Anesthetic total: 1 ml Patient sedated: no Scalpel size: 11 Incision type: single straight Complexity: simple Drainage: purulent and  bloody Drainage amount: scant Wound treatment: wound left open Comments: THe cyst is solid. With pressure by a Q-tip a small amt of white pruritic fluid was expressed. Additional drainage small amt of blood.    (including critical care time) Labs Review Labs Reviewed - No data to display  Imaging Review No results found.   MDM   1. Infected sebaceous cyst      I&D Cotton placed over external auditory meatus Clindamycin 300 twice a day for 6 days Warm compresses Norco one half to one tablet every 4-6 hours when necessary pain Follow-up with ENT as on first page 4 I getting better, worsening or problems.    Hayden Rasmussenavid Amandalynn Pitz,  NP 07/05/14 1135

## 2014-07-05 NOTE — Discharge Instructions (Signed)
Epidermal Cyst An epidermal cyst is usually a small, painless lump under the skin. Cysts often occur on the face, neck, stomach, chest, or genitals. The cyst may be filled with a bad smelling paste. Do not pop your cyst. Popping the cyst can cause pain and puffiness (swelling). HOME CARE   Only take medicines as told by your doctor.  Take your medicine (antibiotics) as told. Finish it even if you start to feel better. Warm compresses. GET HELP RIGHT AWAY IF: Call the on call ENT doctor as needed or if worse or no better  Your cyst is tender, red, or puffy.  You are not getting better, or you are getting worse.  You have any questions or concerns. MAKE SURE YOU:  Understand these instructions.  Will watch your condition.  Will get help right away if you are not doing well or get worse. Document Released: 06/13/2004 Document Revised: 11/05/2011 Document Reviewed: 11/12/2010 [facility]ExitCare Patient Information 2015 [place]. This information is not intended to replace advice given to you by your health care provider. Make sure you discuss any questions you have with your health care provider.

## 2016-02-08 IMAGING — DX DG CHEST 2V
2 series · 2 of 2 positions shown · non-contrast
Comparison: None.

CLINICAL DATA: Acute onset of shortness of breath. Recent placement
of femoral graft, 2 weeks ago, with pain and drainage. Initial
encounter.

EXAM:
CHEST  2 VIEW

[chest lat]
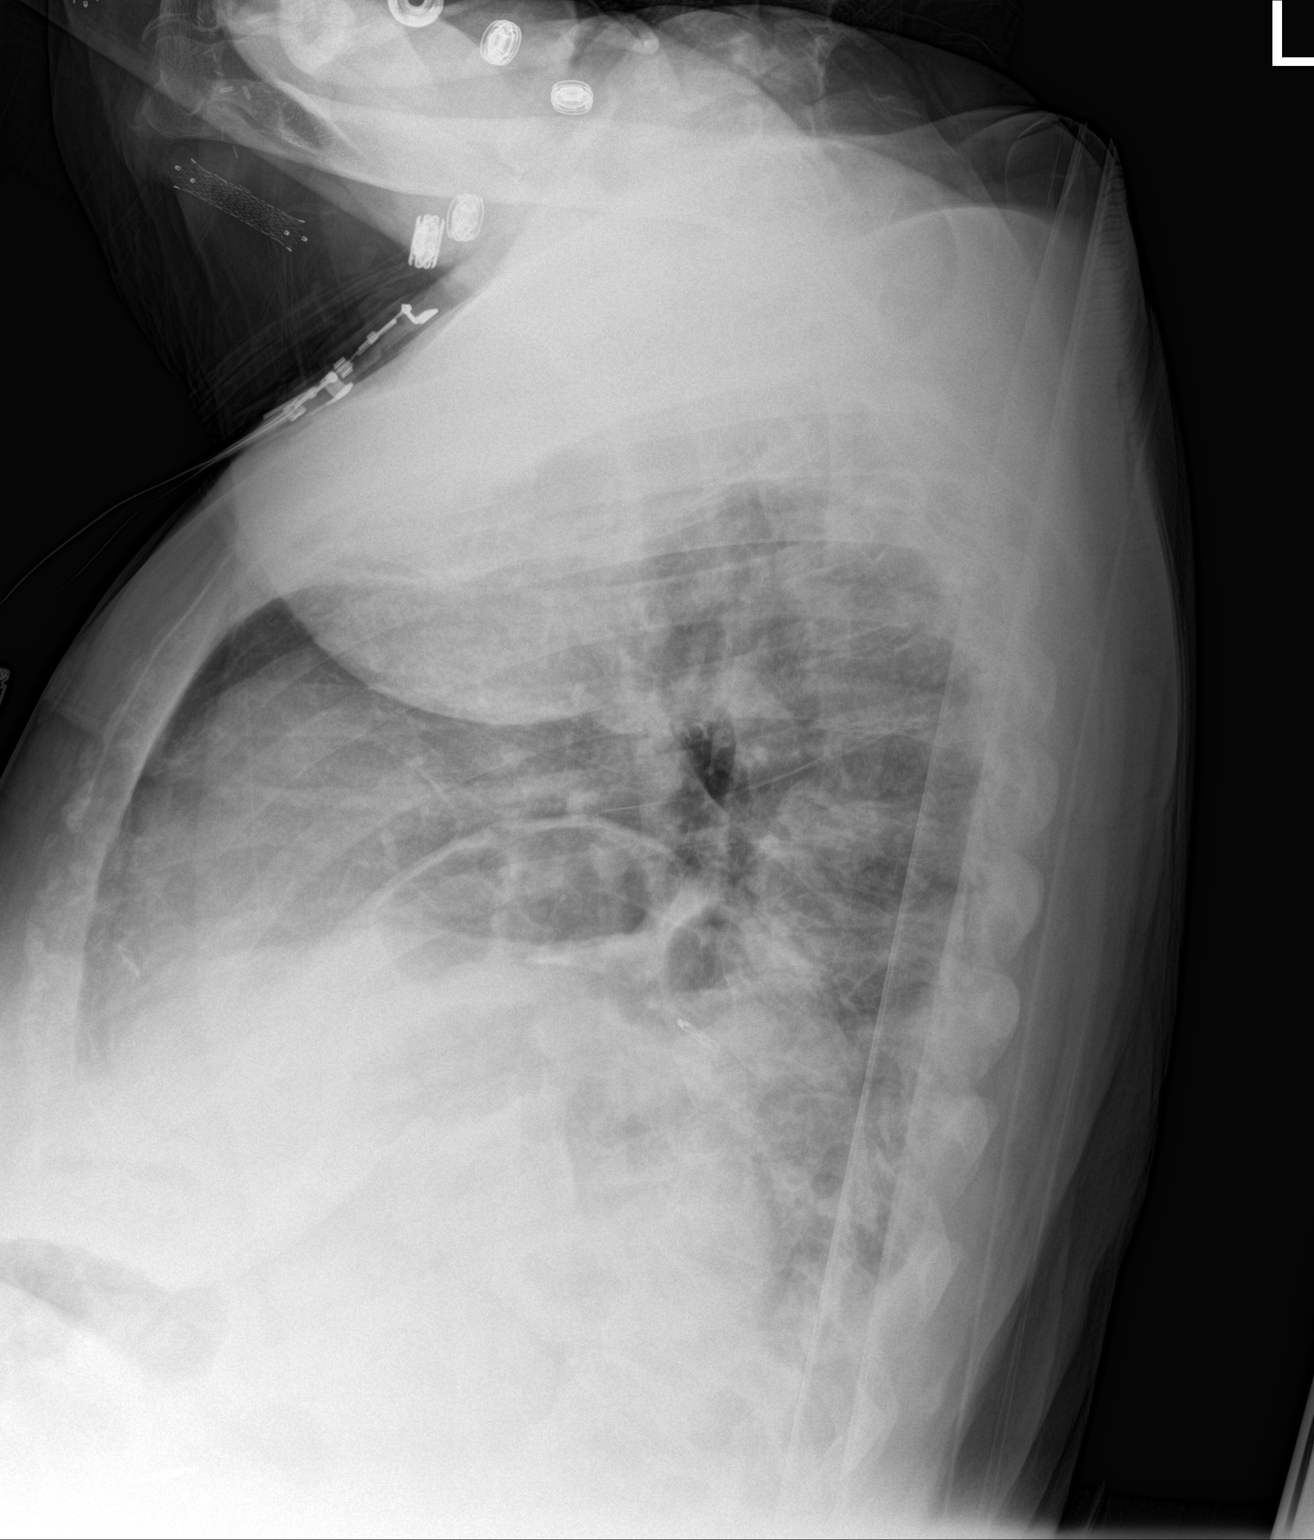

[chest ap]
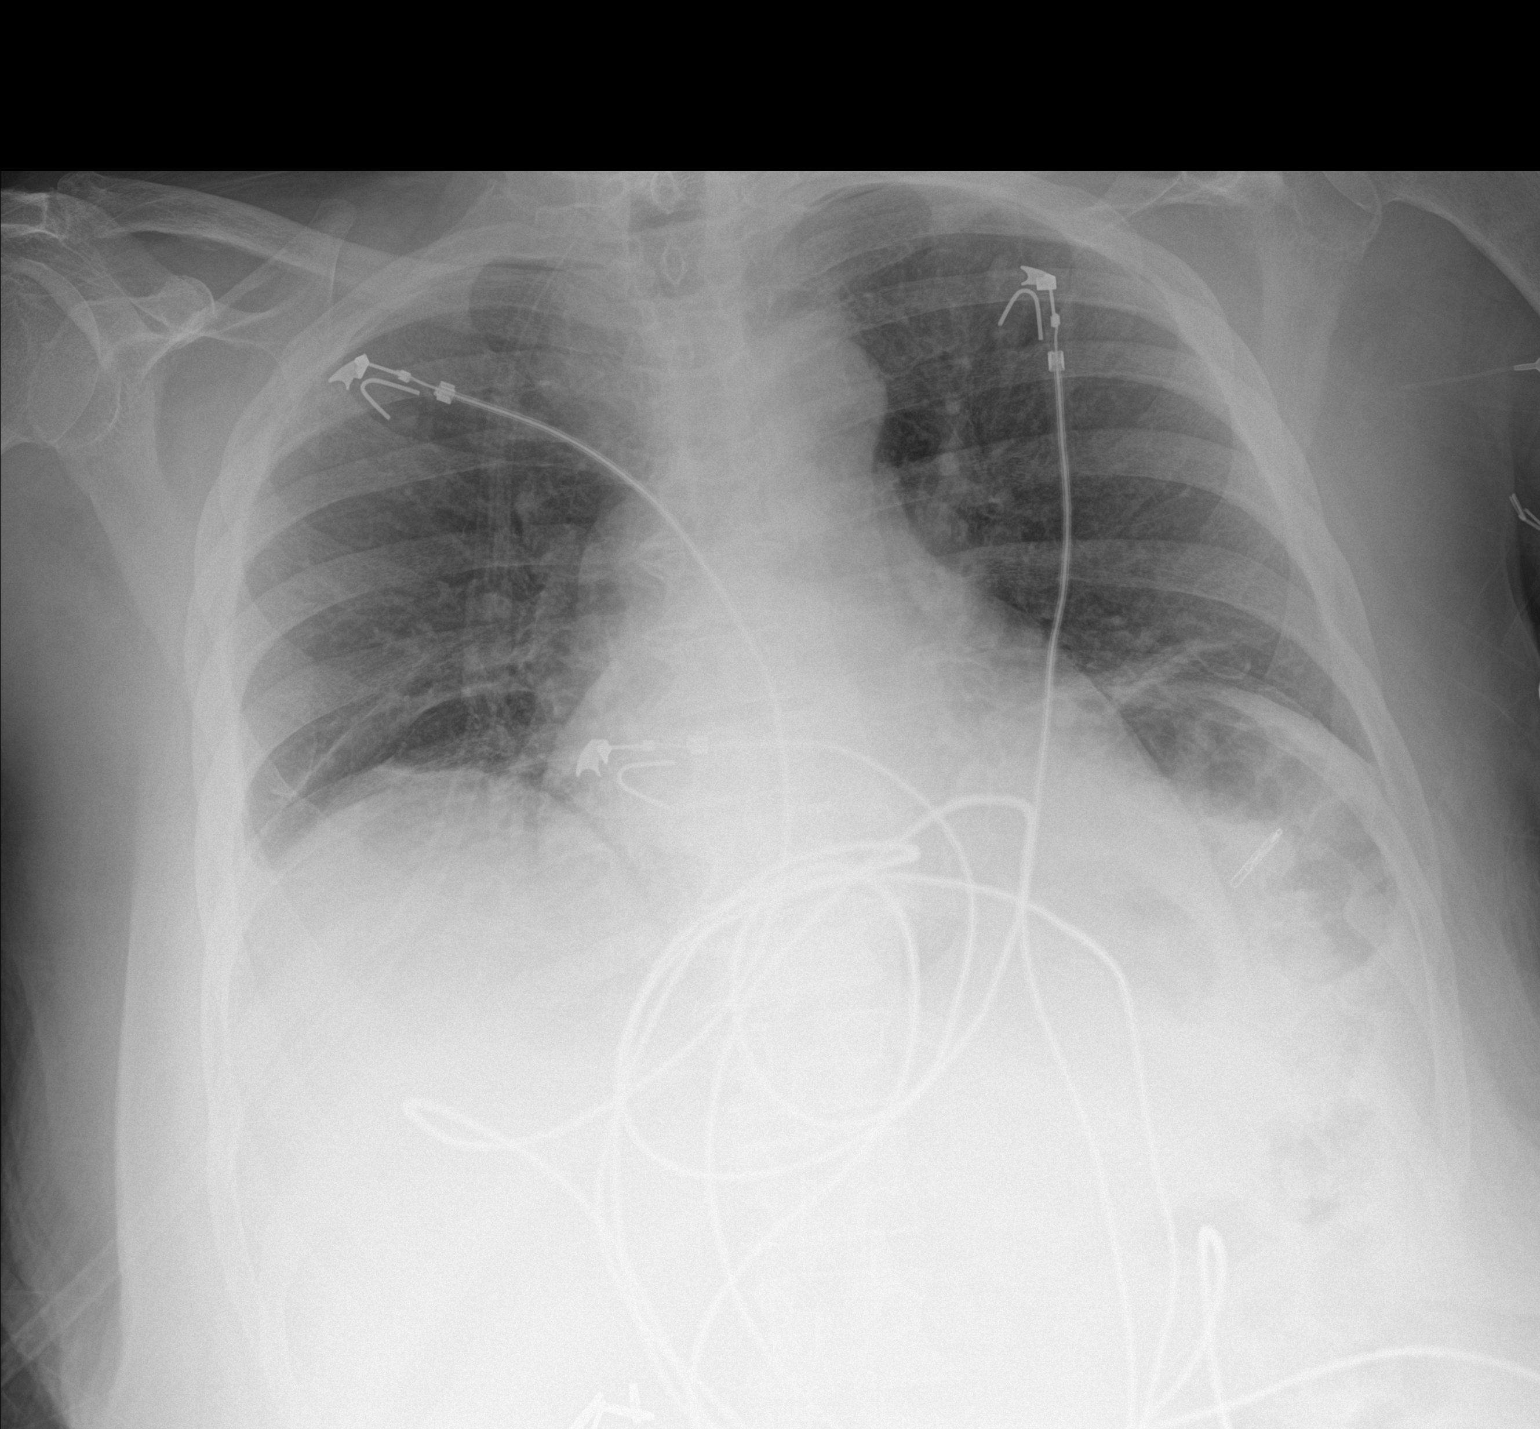

[2 of 2 positions shown; findings below may reference images not displayed]

FINDINGS: The lungs are hypoexpanded. Mild bibasilar linear atelectasis is
noted. There is no evidence of pleural effusion or pneumothorax.

The cardiomediastinal silhouette is borderline normal in size. No
acute osseous abnormalities are seen.
IMPRESSION: Lungs hypoexpanded, with mild bibasilar linear atelectasis.

## 2016-02-20 IMAGING — CR DG CHEST 1V PORT
1 series · 1 of 1 positions shown · non-contrast
Comparison: 04/27/2014

CLINICAL DATA: Congestive heart failure.

EXAM:
PORTABLE CHEST - 1 VIEW

[AP]
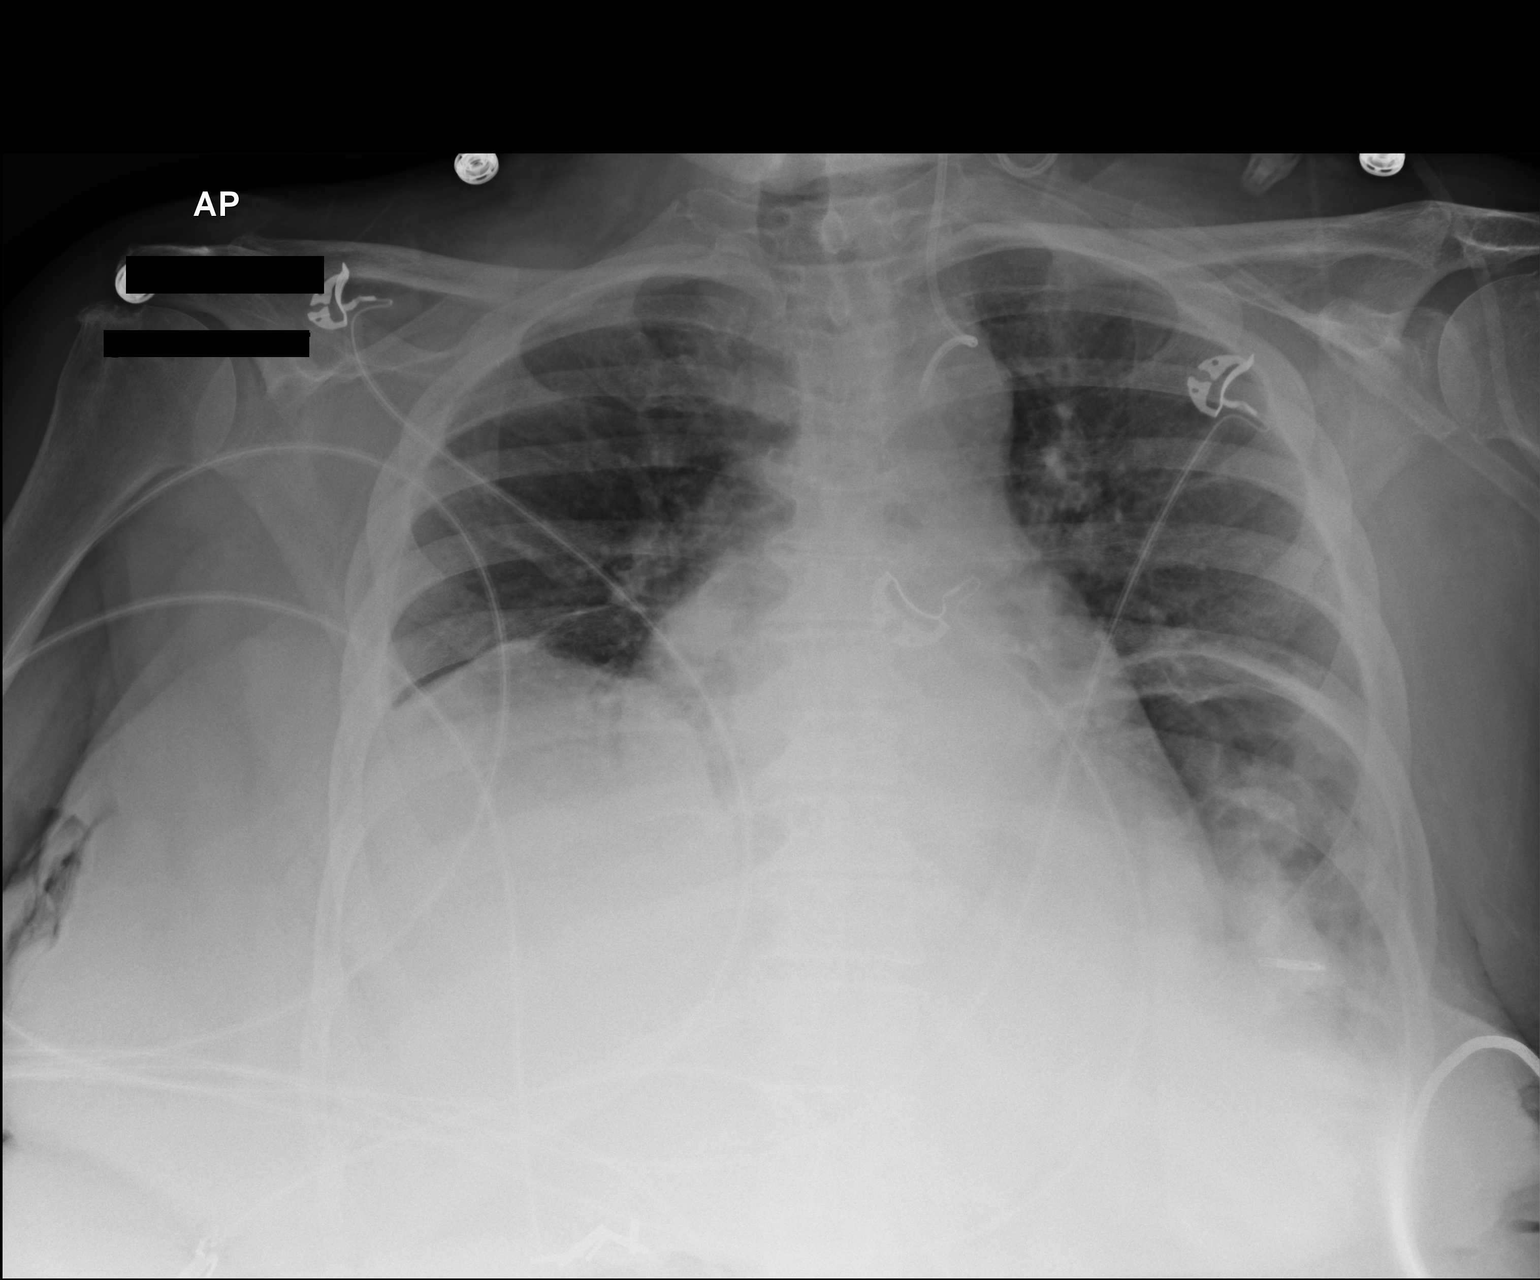

[1 of 1 positions shown; findings below may reference images not displayed]

FINDINGS: Lung volumes remain low. There is no lung consolidation or pulmonary
edema. No convincing pleural effusion and no pneumothorax.

Left internal jugular central venous line is unchanged. Tip projects
over the aortic knob. The exact position of the tip is unclear based
on this single frontal view.

Cardiac silhouette is mildly enlarged.
IMPRESSION: 1. No acute findings in the lungs. No evidence of congestive heart
failure.
2. No change from the prior exam.
